# Patient Record
Sex: Female | Born: 1935 | Race: White | Hispanic: No | Smoking: Never smoker
Health system: Southern US, Community
[De-identification: ages and names within clinical notes are randomized; demographics above are authoritative.]

## PROBLEM LIST (undated history)

## (undated) DIAGNOSIS — F39 Unspecified mood [affective] disorder: Secondary | ICD-10-CM

## (undated) DIAGNOSIS — E039 Hypothyroidism, unspecified: Secondary | ICD-10-CM

## (undated) DIAGNOSIS — I10 Essential (primary) hypertension: Secondary | ICD-10-CM

## (undated) DIAGNOSIS — F0280 Dementia in other diseases classified elsewhere without behavioral disturbance: Secondary | ICD-10-CM

## (undated) HISTORY — PX: KNEE SURGERY: SHX244

## (undated) HISTORY — DX: Hypothyroidism, unspecified: E03.9

## (undated) HISTORY — PX: ABDOMINAL HYSTERECTOMY: SHX81

## (undated) HISTORY — PX: APPENDECTOMY: SHX54

## (undated) HISTORY — DX: Unspecified mood (affective) disorder: F39

## (undated) HISTORY — PX: BACK SURGERY: SHX140

---

## 2010-03-21 ENCOUNTER — Encounter: Admission: RE | Admit: 2010-03-21 | Discharge: 2010-03-21 | Payer: Self-pay | Admitting: Geriatric Medicine

## 2011-09-02 ENCOUNTER — Emergency Department: Payer: Self-pay | Admitting: *Deleted

## 2014-02-23 ENCOUNTER — Encounter (HOSPITAL_COMMUNITY): Payer: Self-pay | Admitting: Emergency Medicine

## 2014-02-23 ENCOUNTER — Emergency Department (HOSPITAL_COMMUNITY): Payer: Medicare Other

## 2014-02-23 ENCOUNTER — Emergency Department (HOSPITAL_COMMUNITY)
Admission: EM | Admit: 2014-02-23 | Discharge: 2014-02-23 | Disposition: A | Discharge status: Home / Self Care | Payer: Medicare Other | Attending: Emergency Medicine | Admitting: Emergency Medicine

## 2014-02-23 DIAGNOSIS — I10 Essential (primary) hypertension: Secondary | ICD-10-CM | POA: Diagnosis not present

## 2014-02-23 DIAGNOSIS — Z880 Allergy status to penicillin: Secondary | ICD-10-CM | POA: Diagnosis not present

## 2014-02-23 DIAGNOSIS — Z79899 Other long term (current) drug therapy: Secondary | ICD-10-CM | POA: Insufficient documentation

## 2014-02-23 DIAGNOSIS — R1011 Right upper quadrant pain: Secondary | ICD-10-CM | POA: Insufficient documentation

## 2014-02-23 DIAGNOSIS — Z7982 Long term (current) use of aspirin: Secondary | ICD-10-CM | POA: Insufficient documentation

## 2014-02-23 DIAGNOSIS — M545 Low back pain, unspecified: Secondary | ICD-10-CM | POA: Diagnosis not present

## 2014-02-23 DIAGNOSIS — R1013 Epigastric pain: Secondary | ICD-10-CM | POA: Insufficient documentation

## 2014-02-23 HISTORY — DX: Essential (primary) hypertension: I10

## 2014-02-23 LAB — CBC WITH DIFFERENTIAL/PLATELET
BASOS ABS: 0 10*3/uL (ref 0.0–0.1)
Basophils Relative: 1 % (ref 0–1)
EOS ABS: 0.2 10*3/uL (ref 0.0–0.7)
EOS PCT: 2 % (ref 0–5)
HCT: 40.1 % (ref 36.0–46.0)
Hemoglobin: 13.7 g/dL (ref 12.0–15.0)
Lymphocytes Relative: 24 % (ref 12–46)
Lymphs Abs: 1.6 10*3/uL (ref 0.7–4.0)
MCH: 31.5 pg (ref 26.0–34.0)
MCHC: 34.2 g/dL (ref 30.0–36.0)
MCV: 92.2 fL (ref 78.0–100.0)
MONO ABS: 0.8 10*3/uL (ref 0.1–1.0)
Monocytes Relative: 11 % (ref 3–12)
Neutro Abs: 4.2 10*3/uL (ref 1.7–7.7)
Neutrophils Relative %: 62 % (ref 43–77)
PLATELETS: 276 10*3/uL (ref 150–400)
RBC: 4.35 MIL/uL (ref 3.87–5.11)
RDW: 13.2 % (ref 11.5–15.5)
WBC: 6.8 10*3/uL (ref 4.0–10.5)

## 2014-02-23 LAB — COMPREHENSIVE METABOLIC PANEL
ALT: 24 U/L (ref 0–35)
AST: 23 U/L (ref 0–37)
Albumin: 3.9 g/dL (ref 3.5–5.2)
Alkaline Phosphatase: 94 U/L (ref 39–117)
Anion gap: 16 — ABNORMAL HIGH (ref 5–15)
BUN: 16 mg/dL (ref 6–23)
CALCIUM: 9.7 mg/dL (ref 8.4–10.5)
CO2: 23 mEq/L (ref 19–32)
CREATININE: 0.83 mg/dL (ref 0.50–1.10)
Chloride: 97 mEq/L (ref 96–112)
GFR calc non Af Amer: 61 mL/min — ABNORMAL LOW (ref >90)
GFR, EST AFRICAN AMERICAN: 70 mL/min — AB (ref >90)
Glucose, Bld: 104 mg/dL — ABNORMAL HIGH (ref 70–99)
Potassium: 3.9 mEq/L (ref 3.7–5.3)
Sodium: 136 mEq/L — ABNORMAL LOW (ref 137–147)
TOTAL PROTEIN: 7.1 g/dL (ref 6.0–8.3)
Total Bilirubin: 0.6 mg/dL (ref 0.3–1.2)

## 2014-02-23 LAB — URINE MICROSCOPIC-ADD ON

## 2014-02-23 LAB — URINALYSIS, ROUTINE W REFLEX MICROSCOPIC
Bilirubin Urine: NEGATIVE
GLUCOSE, UA: NEGATIVE mg/dL
Hgb urine dipstick: NEGATIVE
KETONES UR: NEGATIVE mg/dL
NITRITE: NEGATIVE
Protein, ur: NEGATIVE mg/dL
Specific Gravity, Urine: 1.007 (ref 1.005–1.030)
Urobilinogen, UA: 1 mg/dL (ref 0.0–1.0)
pH: 7 (ref 5.0–8.0)

## 2014-02-23 LAB — LIPASE, BLOOD: LIPASE: 27 U/L (ref 11–59)

## 2014-02-23 MED ORDER — HYDROCODONE-ACETAMINOPHEN 5-325 MG PO TABS: 1.0000 | ORAL_TABLET | ORAL | Status: DC | PRN

## 2014-02-23 MED ORDER — HYDROCODONE-ACETAMINOPHEN 5-325 MG PO TABS
2.0000 | ORAL_TABLET | Freq: Once | ORAL | Status: AC
  Administered 2014-02-23: 2 via ORAL
  Filled 2014-02-23: qty 2

## 2014-02-23 NOTE — ED Notes (Addendum)
Pt gone to CT at this time.

## 2014-02-23 NOTE — ED Notes (Signed)
Upon walking into pt room, family member found to be trying to log into [employer] to see if "her labs were done yet."

## 2014-02-23 NOTE — Progress Notes (Signed)
  CARE MANAGEMENT ED NOTE 02/23/2014  Patient:  Nichole Travis,Nichole Travis   Account Number:  [id number]  Date Initiated:  02/23/2014  Documentation initiated by:  Livia Snellen  Subjective/Objective Assessment:   Patient presents to Ed with back pain.     Subjective/Objective Assessment Detail:   hx of HTN, back surg x2     Action/Plan:   Action/Plan Detail:   Anticipated DC Date:       Status Recommendation to Physician:   Result of Recommendation:    Other ED [place]  Other  PCP issues    Choice offered to / List presented to:            Status of service:  Completed, signed off  ED Comments:   ED Comments Detail:  EDCM spoke to patient at bedisde.  Patient confirms her pcp is Dr. Felipa Eth.  System updated

## 2014-02-23 NOTE — ED Notes (Signed)
Pt states that he back started hurting 8/26 when she traveled. Denies falling or any strenuous activity on Wednesday. Pain has increased since then and has gotten to the point it hurts to sit down. Pain in located in lower back and moves to front of the abdomen. Pain moves down her right leg. Past history of back and knee surgery. Denies any n/v.  Patient denies taking pain medication this morning, only her daily medications.

## 2014-02-23 NOTE — Discharge Instructions (Signed)
Take 1 Vicodin every 4 hours as needed for pain Followup with your primary care for further evaluation and management of your back pain Return to ED for further evaluation if you experience fevers, abdominal pain, numbness or weakness, loss of bowel or bladder function, any new rashes.

## 2014-02-23 NOTE — ED Notes (Signed)
Pt presents to ED with complaints of lower back pain that radiates to the front of the abdomen and radiates down the right leg. Denies any strenuous activity or injuries. Denies n/v and diarrhea. Denies chills and fever.

## 2014-02-23 NOTE — ED Provider Notes (Signed)
CSN: [id number]     Arrival date & time 02/23/14  1235 History   First MD Initiated Contact with Patient 02/23/14 1625     Chief Complaint  Patient presents with  . Back Pain     (Consider location/radiation/quality/duration/timing/severity/associated sxs/prior Treatment) HPI Dayne Passarella is a 80 y.o. female with hx of HTN, back surg x2, who comes in for evaluation of R lower back pain. She reports the pain started 3 days ago when she was getting into a friends car. She denies any injury or trauma at the time. She says the pain has gotten progressively worse over the last three days, is intermittent and radiates to her RUQ. She reports the pain is a 10 at its worst and is aggravated with movement and position.There is no association with eating pattern. She has been able to walk independently with the assistance of her cane. She denies any numbness or tingling. No loss of bowel or bladder function. Her son is with her and reports she has had 2 falls recently, the most recent one a month ago and she tripped while stepping over a threshold of a door. Son reports at that time she landed on her L side. She denies fevers, SOB, cp, N/V, D/C, weakness or dizziness. No underlying cardiovascular or pulmonary disease. Non smoker.  Past Medical History  Diagnosis Date  . Hypertension    Past Surgical History  Procedure Laterality Date  . Back surgery    . Abdominal hysterectomy    . Knee surgery Bilateral    History reviewed. No pertinent family history. History  Substance Use Topics  . Smoking status: Never Smoker   . Smokeless tobacco: Not on file  . Alcohol Use: 1.2 oz/week    2 Glasses of wine per week   OB History   Grav Para Term Preterm Abortions TAB SAB Ect Mult Living                 Review of Systems  Constitutional: Negative for fever.  HENT: Negative for sore throat.   Eyes: Negative for visual disturbance.  Respiratory: Negative for shortness of breath.   Cardiovascular:  Negative for chest pain.  Gastrointestinal: Positive for abdominal pain.  Endocrine: Negative for polyuria.  Genitourinary: Negative for dysuria.  Musculoskeletal: Positive for back pain.  Skin: Negative for rash.  Neurological: Negative for headaches.  All other systems reviewed and are negative.     Allergies  Penicillins  Home Medications   Prior to Admission medications   Medication Sig Start Date End Date Taking? Authorizing Provider  acetaminophen (TYLENOL) 500 MG tablet Take 1,000 mg by mouth every 6 (six) hours as needed for moderate pain.   Yes Historical Provider, MD  amLODipine (NORVASC) 10 MG tablet Take 10 mg by mouth daily.   Yes Historical Provider, MD  aspirin 325 MG EC tablet Take 325 mg by mouth daily.   Yes Historical Provider, MD  citalopram (CELEXA) 40 MG tablet Take 40 mg by mouth daily.   Yes Historical Provider, MD  Glucosamine-Chondroitin (OSTEO BI-FLEX REGULAR STRENGTH) 250-200 MG TABS Take 2 tablets by mouth See admin instructions. Daily after meals   Yes Historical Provider, MD  hydrOXYzine (ATARAX/VISTARIL) 10 MG tablet Take 10 mg by mouth 3 times/day as needed-between meals & bedtime for itching.   Yes Historical Provider, MD  levothyroxine (SYNTHROID, LEVOTHROID) 50 MCG tablet Take 50 mcg by mouth daily before breakfast.   Yes Historical Provider, MD  losartan (COZAAR) 25 MG tablet Take  25 mg by mouth daily.   Yes Historical Provider, MD  metoprolol succinate (TOPROL-XL) 50 MG 24 hr tablet Take 50 mg by mouth daily. Take with or immediately following a meal.   Yes Historical Provider, MD  Multiple Vitamin (MULTIVITAMIN WITH MINERALS) TABS tablet Take 1 tablet by mouth daily.   Yes Historical Provider, MD  Multiple Vitamins-Minerals (ONE-A-DAY 50 PLUS PO) Take 1 tablet by mouth daily.   Yes Historical Provider, MD  HYDROcodone-acetaminophen (NORCO/VICODIN) 5-325 MG per tablet Take 1 tablet by mouth every 4 (four) hours as needed for moderate pain or severe  pain. 02/23/14   Viona Gilmore Gavinn Collard, PA-C   BP 148/61  Pulse 58  Temp(Src) 99 F (37.2 C) (Oral)  Resp 18  Ht 5\' 7"  (1.702 m)  Wt 210 lb (95.255 kg)  BMI 32.88 kg/m2  SpO2 94% Physical Exam  Nursing note and vitals reviewed. Constitutional: She is oriented to person, place, and time.  Awake, alert, nontoxic appearance with baseline speech.  HENT:  Head: Atraumatic.  Eyes: Pupils are equal, round, and reactive to light. Right eye exhibits no discharge. Left eye exhibits no discharge.  Neck: Neck supple.  Cardiovascular: Normal rate and regular rhythm.   No murmur heard. Pulmonary/Chest: Effort normal and breath sounds normal. No respiratory distress. She has no wheezes. She has no rales. She exhibits no tenderness.  Abdominal: Soft. Bowel sounds are normal. She exhibits no mass. There is no rebound.  Mild TTP over RUQ and diffuse tenderness in mid epigastrium. No pulsatile masses appreciated  Musculoskeletal:  Tenderness located over R lumbar paravertebral muscles. No bony tenderness. Bilateral lower extremities non tender without new rashes or color change, baseline ROM with intact DP / PT pulses, CR<2 secs all digits bilaterally, sensation baseline light touch bilaterally for pt, motor symmetric bilateral 5 / 5 hip flexion, quadriceps, hamstrings, EHL, foot dorsiflexion, foot plantarflexion, gait somewhat antalgic but without apparent new ataxia.  Neurological: She is alert and oriented to person, place, and time.  Mental status baseline for patient.  Upper and lower extremity motor strength and sensation intact and symmetric bilaterally.  Skin: Skin is warm and dry. No rash noted.  Psychiatric: She has a normal mood and affect.    ED Course  Procedures (including critical care time) Labs Review Labs Reviewed  COMPREHENSIVE METABOLIC PANEL - Abnormal; Notable for the following:    Sodium 136 (*)    Glucose, Bld 104 (*)    GFR calc non Af Amer 61 (*)    GFR calc Af Amer 70  (*)    Anion gap 16 (*)    All other components within normal limits  URINALYSIS, ROUTINE W REFLEX MICROSCOPIC - Abnormal; Notable for the following:    Leukocytes, UA SMALL (*)    All other components within normal limits  CBC WITH DIFFERENTIAL  LIPASE, BLOOD  URINE MICROSCOPIC-ADD ON    Imaging Review Ct Abdomen Pelvis Wo Contrast  02/23/2014   CLINICAL DATA:  Back pain, lower abdominal pain, right leg pain  EXAM: CT ABDOMEN AND PELVIS WITHOUT CONTRAST  TECHNIQUE: Multidetector CT imaging of the abdomen and pelvis was performed following the standard protocol without IV contrast.  COMPARISON:  None.  FINDINGS: Sagittal images of the spine shows extensive degenerative changes lumbar spine multilevel.  Degenerative changes bilateral SI joints.  Small hiatal hernia. The lung bases are unremarkable. Unenhanced liver shows no biliary ductal dilatation. The gallbladder is not identified. Unenhanced pancreas is atrophic. The spleen and adrenal glands  are unremarkable. Kidneys are symmetrical in size. No nephrolithiasis. No hydronephrosis or hydroureter. Mild atherosclerotic calcifications of abdominal aorta and iliac arteries.  No small bowel obstruction. No ascites or free air. No adenopathy. Left colon diverticula are noted. Multiple sigmoid colon diverticula. No evidence of acute diverticulitis. There is significant distended urinary bladder. The patient is status post hysterectomy.  There is no pericecal inflammation. The terminal ileum is unremarkable.  IMPRESSION: 1. No nephrolithiasis.  No hydronephrosis. or hydroureter. 2. Significant distended urinary bladder without evidence of calcified calculi. 3. Status post hysterectomy. 4. No calcified ureteral calculi. 5. Left colon and sigmoid colon diverticula are noted. No evidence of acute diverticulitis. 6. Degenerative changes lumbar spine and bilateral SI joints.   Electronically Signed   By: Lahoma Crocker M.D.   On: 02/23/2014 17:25     EKG  Interpretation None     Meds given in ED:  Medications  HYDROcodone-acetaminophen (NORCO/VICODIN) 5-325 MG per tablet 2 tablet (2 tablets Oral Given 02/23/14 1704)    New Prescriptions   HYDROCODONE-ACETAMINOPHEN (NORCO/VICODIN) 5-325 MG PER TABLET    Take 1 tablet by mouth every 4 (four) hours as needed for moderate pain or severe pain.   Filed Vitals:   02/23/14 1325 02/23/14 1609 02/23/14 1829  BP: 149/72 174/65 148/61  Pulse: 63 63 58  Temp: 99 F (37.2 C)    TempSrc: Oral    Resp: 24 18 18   Height: 5\' 7"  (1.702 m)    Weight: 210 lb (95.255 kg)    SpO2: 100% 100% 94%    MDM  Vitals stable - WNL -afebrile Pt resting comfortably in ED. PE consistent with MSK etiology- pain reproducible Labwork not concerning for infection  Imaging shows no emergent bony pathology, no AAA or kidney pathology, Clinical picture not consistent with cauda equina. DDX- will discuss potential for Zoster and f/u precautions Will DC with pain management and instructions for f/u. Discussed f/u with PCP and return precautions, pt very amenable to plan.   Final diagnoses:  Right-sided low back pain without sciatica  Prior to patient discharge, I discussed and reviewed this case with Dr.Steinl         Verl Dicker, PA-C 02/23/14 1835

## 2014-02-24 NOTE — ED Provider Notes (Signed)
Medical screening examination/treatment/procedure(s) were conducted as a shared visit with non-physician practitioner(s) and myself.  I personally evaluated the patient during the encounter.  Pt c/o low back pain. Dull. No hx chronic back pain. No hx aaa. Mainly right sided. No radicular pain. No perineal or leg numbness. No weakness. No fever or chills. No rash/shingles to area of pain. Spine without focal bony tenderness.    Mirna Mires, MD 02/24/14 1535

## 2016-05-19 ENCOUNTER — Ambulatory Visit (INDEPENDENT_AMBULATORY_CARE_PROVIDER_SITE_OTHER): Payer: Medicare Other | Admitting: Interventional Cardiology

## 2016-05-19 VITALS — BP 118/74 | HR 64 | Ht 66.5 in | Wt 202.0 lb

## 2016-05-19 DIAGNOSIS — I10 Essential (primary) hypertension: Secondary | ICD-10-CM

## 2016-05-19 DIAGNOSIS — R55 Syncope and collapse: Secondary | ICD-10-CM | POA: Diagnosis not present

## 2016-05-19 DIAGNOSIS — Z136 Encounter for screening for cardiovascular disorders: Secondary | ICD-10-CM | POA: Diagnosis not present

## 2016-05-19 NOTE — Progress Notes (Signed)
Cardiology Office Note   Date:  05/19/2016   ID:  Nichole Travis, DOB [birth year], MRN [id number]  PCP:  Mathews Argyle, MD    No chief complaint on file. syncope   Wt Readings from Last 3 Encounters:  05/19/16 91.6 kg (202 lb)  02/23/14 95.3 kg (210 lb)       History of Present Illness: Nichole Travis is a 82 y.o. female  Who has had HTN.  She lives independently in a retirement community.  She drives and cooks her own meals. She does not do any regular walking for exercise but she can walk if she needs to complete a task.  A few days ago, she was sitting in a chair in her apartment and felt like she passed out. The last time she remembers was 6:15 PM. When she woke up, there was some indication that it was 8 PM but she does not recall exactly what that indication was. She got up, walked to the bedroom and slept the rest of the night. She woke up in the morning and felt fine. She has not had any lightheadedness or syncope since that time. She has had a normal appetite. She has been completing her tasks like she normally does.  She is adamant that she did not fall asleep. This was something different.    Past Medical History:  Diagnosis Date  . Hypertension     Past Surgical History:  Procedure Laterality Date  . ABDOMINAL HYSTERECTOMY    . BACK SURGERY    . KNEE SURGERY Bilateral      Current Outpatient Prescriptions  Medication Sig Dispense Refill  . acetaminophen (TYLENOL) 500 MG tablet Take 1,000 mg by mouth every 6 (six) hours as needed for moderate pain.    Marland Kitchen amLODipine (NORVASC) 10 MG tablet Take 10 mg by mouth daily.    Marland Kitchen aspirin 325 MG EC tablet Take 325 mg by mouth daily.    . citalopram (CELEXA) 40 MG tablet Take 40 mg by mouth daily.    . Glucosamine-Chondroitin (OSTEO BI-FLEX REGULAR STRENGTH) 250-200 MG TABS Take 2 tablets by mouth See admin instructions. Daily after meals    . hydrOXYzine (ATARAX/VISTARIL) 10 MG tablet Take 10 mg by mouth 3  times/day as needed-between meals & bedtime for itching.    . levothyroxine (SYNTHROID, LEVOTHROID) 50 MCG tablet Take 50 mcg by mouth daily before breakfast.    . losartan (COZAAR) 25 MG tablet Take 25 mg by mouth daily.    . metoprolol succinate (TOPROL-XL) 50 MG 24 hr tablet Take 50 mg by mouth daily. Take with or immediately following a meal.    . Multiple Vitamin (MULTIVITAMIN WITH MINERALS) TABS tablet Take 1 tablet by mouth daily.    . Multiple Vitamins-Minerals (ONE-A-DAY 50 PLUS PO) Take 1 tablet by mouth daily.     No current facility-administered medications for this visit.     Allergies:   Penicillins    Social History:  The patient  reports that she has never smoked. She does not have any smokeless tobacco history on file. She reports that she drinks about 1.2 oz of alcohol per week . She reports that she does not use drugs.   Family History:  The patient's family history is not on file.    ROS:  Please see the history of present illness.   Otherwise, review of systems are positive for episode above of possible syncope, occasional joint pains.   All other systems are  reviewed and negative.    PHYSICAL EXAM: VS:  BP 118/74 (BP Location: Left Arm)   Pulse 64   Ht 5' 6.5" (1.689 m)   Wt 91.6 kg (202 lb)   BMI 32.12 kg/m  , BMI Body mass index is 32.12 kg/m. GEN: Well nourished, well developed, in no acute distress  HEENT: normal  Neck: no JVD, carotid bruits, or masses Cardiac: RRR; no murmurs, rubs, or gallops,no edema  Respiratory:  clear to auscultation bilaterally, normal work of breathing GI: soft, nontender, nondistended, + BS MS: no deformity or atrophy  Skin: warm and dry, no rash Neuro:  Strength and sensation are intact Psych: euthymic mood, full affect   EKG:   The ekg ordered today demonstrates normal sinus rhythm, no ST segment changes, no conduction abnormalities   Recent Labs: No results found for requested labs within last 8760 hours.   Lipid  Panel No results found for: CHOL, TRIG, HDL, CHOLHDL, VLDL, LDLCALC, LDLDIRECT   Other studies Reviewed: Additional studies/ records that were reviewed today with results demonstrating: Lab work was checked by primary care doctor yesterday but results are not available.   ASSESSMENT AND PLAN:  1. ? Syncope: Unclear etiology of this event. She is adamant that she did not fall asleep. I did explain to her that if the event truly was an hour and 45 minutes, it was unlikely to be some heart rhythm disturbance lasting for that long period of time. She has not had any orthostatic symptoms and regardless, the episode happened while she was just sitting in a chair. She has no signs or symptoms of heart failure. Her cardiac exam and ECG are unremarkable. She has no anginal symptoms. I don't think we need to pursue any further cardiac testing. I don't think the episode that she had yesterday came from a cardiac source. I encouraged her to stay well hydrated and eat regularly.  2. Hypertension: Continue current medications. Blood pressure well controlled.   Current medicines are reviewed at length with the patient today.  The patient concerns regarding her medicines were addressed.  The following changes have been made:  No change  Labs/ tests ordered today include:  No orders of the defined types were placed in this encounter.   Recommend 150 minutes/week of aerobic exercise Low fat, low carb, high fiber diet recommended  Disposition:   FU prn   Signed, Larae Grooms, MD  05/19/2016 3:38 PM    [facility] [address], [place], [place]  [address] Phone: [phone]; Fax: [phone]

## 2016-05-19 NOTE — Patient Instructions (Signed)
Medication Instructions:  Same-no change  Labwork: None  Testing/Procedures: None  Follow-Up: As needed     If you need a refill on your cardiac medications before your next appointment, please call your pharmacy.

## 2016-05-20 ENCOUNTER — Encounter: Payer: Self-pay | Admitting: Interventional Cardiology

## 2016-05-20 DIAGNOSIS — I10 Essential (primary) hypertension: Secondary | ICD-10-CM | POA: Insufficient documentation

## 2017-08-13 ENCOUNTER — Other Ambulatory Visit: Payer: Self-pay | Admitting: Nurse Practitioner

## 2017-08-13 ENCOUNTER — Ambulatory Visit
Admission: RE | Admit: 2017-08-13 | Discharge: 2017-08-13 | Disposition: A | Discharge status: Home / Self Care | Payer: Medicare Other | Source: Ambulatory Visit | Attending: Nurse Practitioner | Admitting: Nurse Practitioner

## 2017-08-13 DIAGNOSIS — M25551 Pain in right hip: Secondary | ICD-10-CM

## 2017-08-27 ENCOUNTER — Other Ambulatory Visit: Payer: Self-pay | Admitting: Orthopedic Surgery

## 2017-08-27 DIAGNOSIS — M545 Low back pain: Secondary | ICD-10-CM

## 2017-08-27 DIAGNOSIS — M5410 Radiculopathy, site unspecified: Secondary | ICD-10-CM

## 2017-09-04 ENCOUNTER — Other Ambulatory Visit: Payer: Self-pay

## 2019-08-23 DIAGNOSIS — L120 Bullous pemphigoid: Secondary | ICD-10-CM

## 2019-08-23 HISTORY — DX: Bullous pemphigoid: L12.0

## 2019-08-27 ENCOUNTER — Encounter: Payer: Self-pay | Admitting: Emergency Medicine

## 2019-08-27 ENCOUNTER — Other Ambulatory Visit: Payer: Self-pay

## 2019-08-27 ENCOUNTER — Emergency Department: Payer: Medicare Other

## 2019-08-27 ENCOUNTER — Emergency Department
Admission: EM | Admit: 2019-08-27 | Discharge: 2019-08-27 | Disposition: A | Discharge status: Home / Self Care | Payer: Medicare Other | Attending: Emergency Medicine | Admitting: Emergency Medicine

## 2019-08-27 DIAGNOSIS — W19XXXA Unspecified fall, initial encounter: Secondary | ICD-10-CM

## 2019-08-27 DIAGNOSIS — W010XXA Fall on same level from slipping, tripping and stumbling without subsequent striking against object, initial encounter: Secondary | ICD-10-CM | POA: Diagnosis not present

## 2019-08-27 DIAGNOSIS — Z79899 Other long term (current) drug therapy: Secondary | ICD-10-CM | POA: Insufficient documentation

## 2019-08-27 DIAGNOSIS — F0280 Dementia in other diseases classified elsewhere without behavioral disturbance: Secondary | ICD-10-CM | POA: Insufficient documentation

## 2019-08-27 DIAGNOSIS — G309 Alzheimer's disease, unspecified: Secondary | ICD-10-CM | POA: Insufficient documentation

## 2019-08-27 DIAGNOSIS — Z7982 Long term (current) use of aspirin: Secondary | ICD-10-CM | POA: Diagnosis not present

## 2019-08-27 DIAGNOSIS — Z043 Encounter for examination and observation following other accident: Secondary | ICD-10-CM | POA: Insufficient documentation

## 2019-08-27 DIAGNOSIS — I10 Essential (primary) hypertension: Secondary | ICD-10-CM | POA: Diagnosis not present

## 2019-08-27 HISTORY — DX: Dementia in other diseases classified elsewhere, unspecified severity, without behavioral disturbance, psychotic disturbance, mood disturbance, and anxiety: F02.80

## 2019-08-27 NOTE — Discharge Instructions (Addendum)
Nichole Travis was evaluated in the emergency department.  She had a CT scan of her head which showed no acute findings.  She does not demonstrate signs of other injuries.  She should return to the emergency department for any new or worsening pain, confusion or change in mental status, or any other new or worsening symptoms that are concerning.

## 2019-08-27 NOTE — ED Triage Notes (Signed)
Pt arrives via [facility] for an unwitnessed fall. Pt states that her feet hurt and that made her fall. Pt also stating that right shoulder hurts. Pt has hx of Altzheimer's and HTN.

## 2019-08-27 NOTE — ED Provider Notes (Signed)
[name] Emergency Department Provider Note ____________________________________________   First MD Initiated Contact with Patient 08/27/19 [phone]     (approximate)  I have reviewed the triage vital signs and the nursing notes.   HISTORY  Chief Complaint Fall  Level 5 caveat: History of present illness limited due to dementia  HPI Nichole Travis is a 86 y.o. female with PMH as noted below who presents from her facility after an unwitnessed fall.  The patient knows that she fell, but does not remember what happened.  She states that she thinks she fell because her feet hurt.  She denies any acute pain at this time.  She reported some right shoulder pain at triage, but now states "it does'nt even hurt."  She has no other acute complaints.  Past Medical History:  Diagnosis Date  . Alzheimer disease ([place])   . Hypertension     Patient Active Problem List   Diagnosis Date Noted  . Essential hypertension 05/20/2016    Past Surgical History:  Procedure Laterality Date  . ABDOMINAL HYSTERECTOMY    . BACK SURGERY    . KNEE SURGERY Bilateral     Prior to Admission medications   Medication Sig Start Date End Date Taking? Authorizing Provider  acetaminophen (TYLENOL) 500 MG tablet Take 1,000 mg by mouth every 6 (six) hours as needed for moderate pain.    [provider]  amLODipine (NORVASC) 10 MG tablet Take 10 mg by mouth daily.    [provider]  aspirin 325 MG EC tablet Take 325 mg by mouth daily.    [provider]  citalopram (CELEXA) 40 MG tablet Take 40 mg by mouth daily.    [provider]  Glucosamine-Chondroitin (OSTEO BI-FLEX REGULAR STRENGTH) 250-200 MG TABS Take 2 tablets by mouth See admin instructions. Daily after meals    [provider]  hydrOXYzine (ATARAX/VISTARIL) 10 MG tablet Take 10 mg by mouth 3 times/day as needed-between meals & bedtime for itching.    [provider]    levothyroxine (SYNTHROID, LEVOTHROID) 50 MCG tablet Take 50 mcg by mouth daily before breakfast.    [provider]  losartan (COZAAR) 25 MG tablet Take 25 mg by mouth daily.    [provider]  metoprolol succinate (TOPROL-XL) 50 MG 24 hr tablet Take 50 mg by mouth daily. Take with or immediately following a meal.    [provider]  Multiple Vitamin (MULTIVITAMIN WITH MINERALS) TABS tablet Take 1 tablet by mouth daily.    [provider]  Multiple Vitamins-Minerals (ONE-A-DAY 50 PLUS PO) Take 1 tablet by mouth daily.    [provider]    Allergies Penicillins  No family history on file.  Social History Social History   Tobacco Use  . Smoking status: Never Smoker  . Smokeless tobacco: Never Used  Substance Use Topics  . Alcohol use: Yes    Alcohol/week: 2.0 standard drinks    Types: 2 Glasses of wine per week  . Drug use: No    Review of Systems Level 5 caveat: Unable to obtain review of systems due to dementia    ____________________________________________   PHYSICAL EXAM:  VITAL SIGNS: ED Triage Vitals  Enc Vitals Group     BP 08/27/19 0659 (!) 194/79     Pulse Rate 08/27/19 0659 (!) 58     Resp 08/27/19 0659 (!) 25     Temp 08/27/19 0659 97.7 F (36.5 C)     Temp  Source 08/27/19 0659 Oral     SpO2 08/27/19 0659 94 %     Weight 08/27/19 0655 200 lb (90.7 kg)     Height 08/27/19 0655 5\' 6"  (1.676 m)     Head Circumference --      Peak Flow --      Pain Score --      Pain Loc --      Pain Edu? --      Excl. in [place]? --     Constitutional: Alert, confused.  Very well appearing and in no acute distress. Eyes: Conjunctivae are normal.  EOMI.  PERRLA. Head: Atraumatic. Nose: No congestion/rhinnorhea. Mouth/Throat: Mucous membranes are moist.   Neck: Normal range of motion.  No midline cervical spinal tenderness. Cardiovascular: Normal rate, regular rhythm. Good peripheral circulation. Respiratory: Normal  respiratory effort.  No retractions.  Gastrointestinal: Soft and nontender. No distention.  Genitourinary: No flank tenderness. Musculoskeletal: Extremities warm and well perfused.  Full range of motion to bilateral upper and lower shoulders, elbows, wrists, hips, knees, ankles.  2+ distal pulses in all extremities. Neurologic:  Normal speech and language.  Motor and sensory intact in all extremities.  Normal coordination.   Skin:  Skin is warm and dry. No rash noted. Psychiatric: Calm and cooperative.  ____________________________________________   LABS (all labs ordered are listed, but only abnormal results are displayed)  Labs Reviewed - No data to display ____________________________________________  EKG  ED ECG REPORT I, Arta Silence, the attending physician, personally viewed and interpreted this ECG.  Date: 08/27/2019 EKG Time: 0657 Rate: 61 Rhythm: normal sinus rhythm QRS Axis: normal Intervals: normal ST/T Wave abnormalities: normal Narrative Interpretation: no evidence of acute ischemia  ____________________________________________  RADIOLOGY  CT head: No ICH or other acute abnormalities  ____________________________________________   PROCEDURES  Procedure(s) performed: No  Procedures  Critical Care performed: No ____________________________________________   INITIAL IMPRESSION / ASSESSMENT AND PLAN / ED COURSE  Pertinent labs & imaging results that were available during my care of the patient were reviewed by me and considered in my medical decision making (see chart for details).  86 year old female with PMH as noted above presents with an unwitnessed fall.  She apparently initially reported some right shoulder pain but denies it currently.  She has no other complaints.  On exam, she is very well-appearing.  She is alert and confused, but very responsive and talkative.  Her vital signs are normal except for hypertension.  Neurologic exam is  nonfocal.  There is no visible trauma.  She has normal range of motion to the joints of bilateral upper and lower extremities, including the right shoulder.  There is no tenderness to the shoulder.  At this time, there is no evidence of acute injury.  Because of the patient's age, dementia, inability to remember the event, and the possibility of occult head injury I will obtain a CT head.  If this is negative, the patient will be appropriate for discharge back to her facility.  There is no evidence of acute medical issue or indication for lab work-up.  ----------------------------------------- 8:25 AM on 08/27/2019 -----------------------------------------  CT head is negative.  The patient is stable for discharge to her facility.  Return precautions have been provided.  ____________________________________________   FINAL CLINICAL IMPRESSION(S) / ED DIAGNOSES  Final diagnoses:  Fall, initial encounter      NEW MEDICATIONS STARTED DURING THIS VISIT:  New Prescriptions   No medications on file     Note:  This document  was prepared using Systems analyst and may include unintentional dictation errors.    Arta Silence, MD 08/27/19 [phone]

## 2019-08-27 NOTE — ED Notes (Signed)
Pt placed on bedpan to urinate and have brown BM. Cleaned with wipes and placed brief on pt.

## 2019-08-27 NOTE — ED Notes (Signed)
Attempted to call [facility], was unable to get through.

## 2019-08-27 NOTE — ED Notes (Signed)
Heard pt yelling in room. Pt stated she needed to urinate again. Placed on bedpan. Cleaned with wipes. Dry brief placed on pt.

## 2019-09-15 ENCOUNTER — Other Ambulatory Visit: Payer: Self-pay

## 2019-09-15 ENCOUNTER — Ambulatory Visit (INDEPENDENT_AMBULATORY_CARE_PROVIDER_SITE_OTHER): Payer: Medicare Other | Admitting: Dermatology

## 2019-09-15 DIAGNOSIS — L120 Bullous pemphigoid: Secondary | ICD-10-CM

## 2019-09-15 DIAGNOSIS — L089 Local infection of the skin and subcutaneous tissue, unspecified: Secondary | ICD-10-CM | POA: Diagnosis not present

## 2019-09-15 NOTE — Progress Notes (Signed)
   Follow-Up Visit   Subjective  Nichole Travis is a 86 y.o. female who presents for the following: Pain in feet (B/L foot) and bullous pemphigoid.   She reports itch is better but her heels continue to hurt. She denies new blisters at this time. She is taking medications as instructed. They have had trouble with dressings not staying in place on her heels.  The following portions of the chart were reviewed this encounter and updated as appropriate:     Review of Systems: No other skin or systemic complaints.  Objective  Well appearing patient in no apparent distress; mood and affect are within normal limits.  A focused examination was performed including face, chest, arms, legs, feet. Relevant physical exam findings are noted in the Assessment and Plan.  Objective  trunk and extremities: Few residual erosions, no active bullae today  Assessment & Plan  Bullous pemphigoid trunk and extremities  Improving Continue doxycycline 100 mg bid Continue clobetasol 0.05% ointment bid prn to new areas of itch or blistering Patient just finishing prednisone taper, will monitor  For erosions, cleanse with puracyn and apply duoderm, leave in place for 2-3 days. Can use a wrap to help keep dressing in place over heels  Local infection of skin and subcutaneous tissue Mixed toe webbing B/L foot  Mixed toe web infection  Will continue Ciclopirox BID Will start Gentamicin cream BID  Return for As Scheduled.   Marene Lenz, CMA am acting as scribe for Forest Gleason, MD .  Documentation: I have reviewed the above documentation for accuracy and completeness, and I agree with the above.  Forest Gleason, MD

## 2019-09-19 ENCOUNTER — Telehealth: Payer: Self-pay

## 2019-09-19 MED ORDER — CLOBETASOL PROPIONATE 0.05 % EX OINT: 1.0000 "application " | TOPICAL_OINTMENT | Freq: Two times a day (BID) | CUTANEOUS | 0 refills | Status: DC

## 2019-09-19 NOTE — Telephone Encounter (Signed)
Care taker calling pt has  2 new blisters on her feet

## 2019-09-19 NOTE — Telephone Encounter (Signed)
Called pt care taker discussed starting Clobetasol ointment bid to blisters and if any additional blisters appear let us know.    ERX'd Clobetasol 0.05% ointment to [facility]

## 2019-09-19 NOTE — Telephone Encounter (Signed)
Please advise caretaker to apply clobetasol ointment twice daily to those blisters and surrounding areas. Please let us know if she develops more. Thank you!

## 2019-09-19 NOTE — Telephone Encounter (Signed)
Thank you :)

## 2019-09-20 ENCOUNTER — Encounter: Payer: Self-pay | Admitting: Dermatology

## 2019-09-20 MED ORDER — GENTAMICIN SULFATE 0.1 % EX CREA: 1.0000 "application " | TOPICAL_CREAM | Freq: Two times a day (BID) | CUTANEOUS | 1 refills | Status: AC

## 2019-09-25 DIAGNOSIS — G309 Alzheimer's disease, unspecified: Secondary | ICD-10-CM | POA: Diagnosis not present

## 2019-09-25 DIAGNOSIS — I10 Essential (primary) hypertension: Secondary | ICD-10-CM | POA: Diagnosis not present

## 2019-09-25 DIAGNOSIS — F39 Unspecified mood [affective] disorder: Secondary | ICD-10-CM

## 2019-09-25 DIAGNOSIS — L120 Bullous pemphigoid: Secondary | ICD-10-CM | POA: Diagnosis not present

## 2019-09-25 DIAGNOSIS — G301 Alzheimer's disease with late onset: Secondary | ICD-10-CM | POA: Diagnosis not present

## 2019-09-28 ENCOUNTER — Ambulatory Visit (INDEPENDENT_AMBULATORY_CARE_PROVIDER_SITE_OTHER): Payer: Medicare Other | Admitting: Dermatology

## 2019-09-28 ENCOUNTER — Other Ambulatory Visit: Payer: Self-pay

## 2019-09-28 DIAGNOSIS — S91302D Unspecified open wound, left foot, subsequent encounter: Secondary | ICD-10-CM

## 2019-09-28 DIAGNOSIS — S91301D Unspecified open wound, right foot, subsequent encounter: Secondary | ICD-10-CM

## 2019-09-28 DIAGNOSIS — T148XXA Other injury of unspecified body region, initial encounter: Secondary | ICD-10-CM

## 2019-09-28 DIAGNOSIS — L120 Bullous pemphigoid: Secondary | ICD-10-CM | POA: Diagnosis not present

## 2019-09-28 MED ORDER — PREDNISONE 10 MG PO TABS: 10.0000 mg | ORAL_TABLET | Freq: Every day | ORAL | 0 refills | Status: AC

## 2019-09-28 MED ORDER — PREDNISONE 5 MG PO TABS: 5.0000 mg | ORAL_TABLET | Freq: Every day | ORAL | 0 refills | Status: AC

## 2019-09-28 NOTE — Progress Notes (Signed)
   Follow-Up Visit   Subjective  Nichole Travis is a 86 y.o. female who presents for the following: ulcerations on heels (Follow up, getting daily dressing changes, was moved to skill nursing for next 2 weeks, seems to be getting a little better but still painful  at dressing changes) and Pruritis (Still itchy on arms and head and back, doing gentle skin care.).   The following portions of the chart were reviewed this encounter and updated as appropriate: Tobacco  Allergies  Meds  Problems  Med Hx  Surg Hx  Fam Hx      Review of Systems: No other skin or systemic complaints.  Objective  Well appearing patient in no apparent distress; mood and affect are within normal limits.  A focused examination was performed including extremities, including the arms, hands, fingers, and fingernails and the legs, feet, toes, and toenails. Relevant physical exam findings are noted in the Assessment and Plan.  Objective  Left Foot - Anterior, Left Forearm - Anterior, Left Lower Leg - Anterior, Right Foot - Anterior, Right Forearm - Anterior, Right Lower Leg - Anterior: Edematous red plaques on B/L arms and legs  Multiple draining bullae at feet, ulcerations at heel are improved  Objective  Left Foot - Anterior, Right Foot - Anterior: Multiple draining bullae at lateral and plantar feet, ulcerations at heels are improved  Assessment & Plan  Bullous pemphigoid (6) Left Foot - Anterior; Right Foot - Anterior; Left Forearm - Anterior; Right Forearm - Anterior; Left Lower Leg - Anterior; Right Lower Leg - Anterior  Chronic, currently flaring.  Will cont. Clobetasol twice daily to any areas that have rash or are itchy. Apply twice a day to any blisters that are not draining and surrounding any blisters that are not draining.  Continue Doxycycline 100 mg  BID  Start prednisone 10 mg daily x 14 days, then decrease to 5 mg daily for 14 days.    Risks of prednisone taper discussed including mood  irritability, insomnia, weight gain, stomach ulcers, increased risk of infection, increased blood sugar (diabetes), hypertension, osteoporosis with long-term or frequent use, and rare risk of avascular necrosis of the hip.    If Nichole Travis develops any new blisters, please call our office at [phone] option 4 to let us know as we may need to adjust her medication before the next visit   predniSONE (DELTASONE) 10 MG tablet - Left Foot - Anterior, Left Forearm - Anterior, Left Lower Leg - Anterior, Right Foot - Anterior, Right Forearm - Anterior, Right Lower Leg - Anterior  predniSONE (DELTASONE) 5 MG tablet - Left Foot - Anterior, Left Forearm - Anterior, Left Lower Leg - Anterior, Right Foot - Anterior, Right Forearm - Anterior, Right Lower Leg - Anterior  Open wound (2) Left Foot - Anterior; Right Foot - Anterior  To all open areas, apply mupirocin daily covered by non stick dressing (apply before applying any guaze so that the wound stays more moist with mupirocin instead of soaking it up). Then apply bandage or wrap to keep dressing in place. Continue Doxycycline 100 mg  BID  Return in about 3 weeks (around 10/19/2019) for Bullous Pemphigoid.   IDonzetta Travis, CMA, am acting as scribe for Forest Gleason, MD .  Documentation: I have reviewed the above documentation for accuracy and completeness, and I agree with the above.  Forest Gleason, MD

## 2019-10-10 ENCOUNTER — Encounter: Payer: Self-pay | Admitting: Dermatology

## 2019-10-19 ENCOUNTER — Ambulatory Visit (INDEPENDENT_AMBULATORY_CARE_PROVIDER_SITE_OTHER): Payer: Medicare Other | Admitting: Dermatology

## 2019-10-19 ENCOUNTER — Other Ambulatory Visit: Payer: Self-pay

## 2019-10-19 ENCOUNTER — Encounter: Payer: Self-pay | Admitting: Dermatology

## 2019-10-19 DIAGNOSIS — I10 Essential (primary) hypertension: Secondary | ICD-10-CM | POA: Diagnosis not present

## 2019-10-19 DIAGNOSIS — G301 Alzheimer's disease with late onset: Secondary | ICD-10-CM | POA: Diagnosis not present

## 2019-10-19 DIAGNOSIS — L821 Other seborrheic keratosis: Secondary | ICD-10-CM

## 2019-10-19 DIAGNOSIS — F39 Unspecified mood [affective] disorder: Secondary | ICD-10-CM | POA: Diagnosis not present

## 2019-10-19 DIAGNOSIS — L120 Bullous pemphigoid: Secondary | ICD-10-CM

## 2019-10-19 NOTE — Patient Instructions (Addendum)
Gentle Skin Care Guide  1. Bathe no more than once a day.  2. Avoid bathing in hot water  3. Use a mild soap like Dove, Vanicream, Cetaphil, CeraVe. Can use Lever 2000 or       Cetaphil antibacterial soap  4. Use soap only where you need it. On most days, use it under your arms, between       your legs, and on your fee. Let the water rinse other areas unless visibly dirty.  5. When you get out of the bath/shower, use a towel to gently blot your skin dry, don't           rub it.  6. While your skin is still a little damp, apply a moisturizing cream such as Vanicream,       CeraVe, Cetaphil, Eucerin, Sarna lotion or plain Vaseline Jelly. For hands apply        Neutrogena [place] Hand Cream or Excipial Hand Cream.  7. Reapply moisturizer any time you start to itch or feel dry.  8. Sometimes using free and clear laundry detergents can be helpful. Fabric softener       sheets should be avoided. Downy Free & Gentle liquid, or any liquid fabric softener       that is free of dyes and perfumes, it acceptable to use  9. If your doctor has given you prescription creams you may apply moisturizers over them   Doxycycline should be taken with food to prevent nausea. Do not lay down for 30 minutes after taking. Be cautious with sun exposure and use good sun protection while on this medication. Pregnant women should not take this medication.   Topical steroids (such as triamcinolone, fluocinolone, fluocinonide, mometasone, clobetasol, halobetasol, betamethasone, hydrocortisone) can cause thinning and lightening of the skin if they are used for too long in the same area. Your physician has selected the right strength medicine for your problem and area affected on the body. Please use your medication only as directed by your physician to prevent side effects.

## 2019-10-19 NOTE — Progress Notes (Signed)
   Follow-Up Visit   Subjective  Nichole Travis is a 86 y.o. female who presents for the following: Follow-up.  Patient here for 3 week follow up for bullous pemphigoid of the lower legs, feet. She tool 14 days of 10mg  of prednisone and is now on 5mg  x 14 days. Course should be complete 10/28/19. She is also using clobetasol lotion BID and taking doxycycline 100mg  BID.  The following portions of the chart were reviewed this encounter and updated as appropriate:  Tobacco  Allergies  Meds  Problems  Med Hx  Surg Hx  Fam Hx     Review of Systems:  No other skin or systemic complaints except as noted in HPI or Assessment and Plan.  Objective  Well appearing patient in no apparent distress; mood and affect are within normal limits.  A focused examination was performed including lower extremities, including the legs, feet, toes, and toenails and arms, chest, back. Relevant physical exam findings are noted in the Assessment and Plan.  Objective  Feet: Diffuse excoriated pink papules coalescing to plaques at back   Assessment & Plan  Bullous pemphigoid Feet  Chronic, improving, not at goal.   Unfortunately, most other treatments have a worse side effect profile and we definitely want to get Ms. Soutar off of the prednisone as she has had multiple steroid courses.   She currently is sleeping well and doing relatively well. Feet have healed very well. No new blisters.   Cont doxycycline 100mg  1 PO BID with food Cont clobetasol ointment BID prn to affected areas Cont prednisone 5mg  daily until finished with pills (approximately 1 more week)  Plan to start nbUVB 3x per week if family is able to transport her here for it and we can get approved with insurance.  Medically she is doing well and can go back to assisted living. If she develops many new blisters requiring more intensive wound care, it is possible she could need to return to the SNF in future.   Seborrheic Keratoses -  Stuck-on, waxy, tan-brown papules and plaques  - Discussed benign etiology and prognosis. - Observe - Call for any changes   Return in about 6 weeks (around 11/30/2019) for BP follow up.  Graciella Belton, RMA, am acting as scribe for Forest Gleason, MD .   Documentation: I have reviewed the above documentation for accuracy and completeness, and I agree with the above.  Forest Gleason, MD

## 2019-10-20 ENCOUNTER — Encounter: Payer: Self-pay | Admitting: Internal Medicine

## 2019-10-20 DIAGNOSIS — I10 Essential (primary) hypertension: Secondary | ICD-10-CM | POA: Insufficient documentation

## 2019-10-20 DIAGNOSIS — F0280 Dementia in other diseases classified elsewhere without behavioral disturbance: Secondary | ICD-10-CM | POA: Insufficient documentation

## 2019-10-20 DIAGNOSIS — G309 Alzheimer's disease, unspecified: Secondary | ICD-10-CM | POA: Insufficient documentation

## 2019-10-20 DIAGNOSIS — F39 Unspecified mood [affective] disorder: Secondary | ICD-10-CM | POA: Insufficient documentation

## 2019-10-20 DIAGNOSIS — E039 Hypothyroidism, unspecified: Secondary | ICD-10-CM | POA: Insufficient documentation

## 2019-10-24 ENCOUNTER — Other Ambulatory Visit: Payer: Self-pay

## 2019-10-24 ENCOUNTER — Ambulatory Visit (INDEPENDENT_AMBULATORY_CARE_PROVIDER_SITE_OTHER): Payer: Medicare Other

## 2019-10-24 DIAGNOSIS — L120 Bullous pemphigoid: Secondary | ICD-10-CM | POA: Diagnosis not present

## 2019-10-24 NOTE — Progress Notes (Signed)
Patient treated in the lightbox/NVUVB for 1 minute and 28 seconds.

## 2019-10-26 ENCOUNTER — Encounter: Payer: Self-pay | Admitting: Dermatology

## 2019-10-26 ENCOUNTER — Ambulatory Visit (INDEPENDENT_AMBULATORY_CARE_PROVIDER_SITE_OTHER): Payer: Medicare Other

## 2019-10-26 ENCOUNTER — Telehealth: Payer: Self-pay

## 2019-10-26 ENCOUNTER — Other Ambulatory Visit: Payer: Self-pay

## 2019-10-26 DIAGNOSIS — L120 Bullous pemphigoid: Secondary | ICD-10-CM

## 2019-10-26 NOTE — Telephone Encounter (Signed)
Unless she feels its urgent for a doctor to look at it, one of our docs can examine it next time she is in for a Light Treatment. (I assume that will be no later than 1 week?)

## 2019-10-26 NOTE — Telephone Encounter (Signed)
Dr. Laurence Ferrari has been treating this patient for Bullous Pemphigoid for several weeks. Dr. Laurence Ferrari has her coming in for lightbox/NBUVB now. While she was here today the daughter in law noticed a new place on her foot that is red and does not look well (she didn't say anything until after they were gone and she called).   Patient has just recently moved back to her room (assisted living) at [facility].

## 2019-10-26 NOTE — Progress Notes (Signed)
Patient treated in the lightbox/NBUVB for 1 minute and 38 seconds.

## 2019-10-26 NOTE — Telephone Encounter (Signed)
Patient's daughter in law has sent a photo of the blister through [place]. You will find this in her chart review under the "Patient Message". Please review.

## 2019-10-26 NOTE — Telephone Encounter (Signed)
Reviewed message and photo. It appears this is just a Bullous Pemphigoid blister.  It is not unusual to get new ones. Continue current treatment and one of the docs can see her when she is in for her next light treatments.  It does appear she may have tinea pedis too which can also cause blisters.  If it is determined she needs an anti-fungal when doctor evaluates her at next light treatment, that will be prescribed at that time.

## 2019-10-27 NOTE — Telephone Encounter (Signed)
Call patient's daughter in law Jamas Lav and spoke with her regarding information per Dr. Nehemiah Massed.  I also called Luellen Pucker at [facility] and advised her of information as well.   Dr. Nehemiah Massed will see patient in lightbox room on Monday at 2:15.

## 2019-10-30 ENCOUNTER — Ambulatory Visit (INDEPENDENT_AMBULATORY_CARE_PROVIDER_SITE_OTHER): Payer: Medicare Other | Admitting: Dermatology

## 2019-10-30 ENCOUNTER — Ambulatory Visit (INDEPENDENT_AMBULATORY_CARE_PROVIDER_SITE_OTHER): Payer: Medicare Other

## 2019-10-30 ENCOUNTER — Other Ambulatory Visit: Payer: Self-pay

## 2019-10-30 DIAGNOSIS — B353 Tinea pedis: Secondary | ICD-10-CM | POA: Diagnosis not present

## 2019-10-30 DIAGNOSIS — L120 Bullous pemphigoid: Secondary | ICD-10-CM

## 2019-10-30 MED ORDER — KETOCONAZOLE 2 % EX CREA: 1.0000 "application " | TOPICAL_CREAM | Freq: Every evening | CUTANEOUS | 0 refills | Status: DC

## 2019-10-30 MED ORDER — FLUCONAZOLE 200 MG PO TABS: 200.0000 mg | ORAL_TABLET | ORAL | 0 refills | Status: DC

## 2019-10-30 NOTE — Progress Notes (Signed)
Patient treated in the lightbox/NBUVB for 1 minute and 48 seconds.

## 2019-10-30 NOTE — Progress Notes (Signed)
   Follow-Up Visit   Subjective  Nichole Travis is a 86 y.o. female who presents for the following: Follow-up.  Patient here today for NVUVB. Saw Dr. Nehemiah Massed for a new blister on her foot.   The following portions of the chart were reviewed this encounter and updated as appropriate: Tobacco  Allergies  Meds  Problems  Med Hx  Surg Hx  Fam Hx      Review of Systems: No other skin or systemic complaints.  Objective  Well appearing patient in no apparent distress; mood and affect are within normal limits.  A focused examination was performed including feet. Relevant physical exam findings are noted in the Assessment and Plan.  Objective  Left Lateral Surface: Resolving bulla  Objective  Bilateral Feet: Pink scaliness  Assessment & Plan  Bullous pemphigoid Left Lateral Surface  Patient advised it will be fine until it heals.  Discontinue clobetasol cream and only use PRN blisters.   Tinea pedis of right foot Bilateral Feet  Start ketoconazole 2% cream to sides of feet and in between toes qhs  Start Diflucan 200mg  1 PO QD on Monday, Wednesday and Fridays for 4 weeks #12 0RF  Other Related Medications fluconazole (DIFLUCAN) 200 MG tablet ketoconazole (NIZORAL) 2 % cream  I, Johnsie Kindred, RMA, am acting as scribe for Sarina Ser, MD.   Return for as scheduled.   Documentation: I have reviewed the above documentation for accuracy and completeness, and I agree with the above.  Sarina Ser, MD

## 2019-10-30 NOTE — Progress Notes (Signed)
Error

## 2019-10-31 ENCOUNTER — Other Ambulatory Visit: Payer: Self-pay

## 2019-10-31 DIAGNOSIS — B353 Tinea pedis: Secondary | ICD-10-CM

## 2019-10-31 MED ORDER — FLUCONAZOLE 200 MG PO TABS
200.0000 mg | ORAL_TABLET | ORAL | 0 refills | Status: DC
Start: 2019-10-31 — End: 2019-12-06

## 2019-10-31 MED ORDER — KETOCONAZOLE 2 % EX CREA: 1.0000 "application " | TOPICAL_CREAM | Freq: Every evening | CUTANEOUS | 0 refills | Status: DC

## 2019-10-31 NOTE — Progress Notes (Signed)
Prescriptions sent into the wrong pharmacy.

## 2019-11-01 ENCOUNTER — Other Ambulatory Visit: Payer: Self-pay

## 2019-11-01 ENCOUNTER — Encounter: Payer: Self-pay | Admitting: Dermatology

## 2019-11-01 ENCOUNTER — Ambulatory Visit (INDEPENDENT_AMBULATORY_CARE_PROVIDER_SITE_OTHER): Payer: Medicare Other

## 2019-11-01 DIAGNOSIS — L120 Bullous pemphigoid: Secondary | ICD-10-CM

## 2019-11-01 NOTE — Progress Notes (Signed)
Patient treated in the lightbox/NBUVB for 1 minute and 58 seconds.

## 2019-11-06 ENCOUNTER — Ambulatory Visit (INDEPENDENT_AMBULATORY_CARE_PROVIDER_SITE_OTHER): Payer: Medicare Other

## 2019-11-06 ENCOUNTER — Ambulatory Visit: Payer: Medicare Other

## 2019-11-06 ENCOUNTER — Other Ambulatory Visit: Payer: Self-pay

## 2019-11-06 DIAGNOSIS — L120 Bullous pemphigoid: Secondary | ICD-10-CM | POA: Diagnosis not present

## 2019-11-06 NOTE — Progress Notes (Signed)
Patient treated in the lightbox/NBUVB for 2 minutes and 8 seconds.

## 2019-11-08 ENCOUNTER — Ambulatory Visit (INDEPENDENT_AMBULATORY_CARE_PROVIDER_SITE_OTHER): Payer: Medicare Other

## 2019-11-08 ENCOUNTER — Other Ambulatory Visit: Payer: Self-pay

## 2019-11-08 DIAGNOSIS — L120 Bullous pemphigoid: Secondary | ICD-10-CM | POA: Diagnosis not present

## 2019-11-08 NOTE — Progress Notes (Signed)
Patient treated in the lightbox/NBUVB for 2 minutes and 18 seconds.

## 2019-11-09 ENCOUNTER — Ambulatory Visit: Payer: Medicare Other | Admitting: Internal Medicine

## 2019-11-09 ENCOUNTER — Encounter: Payer: Self-pay | Admitting: Internal Medicine

## 2019-11-09 VITALS — BP 132/70 | HR 66 | Temp 98.1°F | Resp 16 | Wt 204.4 lb

## 2019-11-09 DIAGNOSIS — F39 Unspecified mood [affective] disorder: Secondary | ICD-10-CM | POA: Diagnosis not present

## 2019-11-09 DIAGNOSIS — I10 Essential (primary) hypertension: Secondary | ICD-10-CM | POA: Diagnosis not present

## 2019-11-09 DIAGNOSIS — G301 Alzheimer's disease with late onset: Secondary | ICD-10-CM | POA: Diagnosis not present

## 2019-11-09 DIAGNOSIS — E039 Hypothyroidism, unspecified: Secondary | ICD-10-CM

## 2019-11-09 DIAGNOSIS — L120 Bullous pemphigoid: Secondary | ICD-10-CM

## 2019-11-09 DIAGNOSIS — F0280 Dementia in other diseases classified elsewhere without behavioral disturbance: Secondary | ICD-10-CM

## 2019-11-09 NOTE — Addendum Note (Signed)
Addended by: Pilar Grammes on: 11/09/2019 04:50 PM   Modules accepted: Orders

## 2019-11-09 NOTE — Assessment & Plan Note (Signed)
Clinically euthyroid 

## 2019-11-09 NOTE — Assessment & Plan Note (Signed)
Very forgetful but function is stable in AL setting No Rx indicated

## 2019-11-09 NOTE — Assessment & Plan Note (Signed)
Improved but not quiet Still on doxy--and now on fluconazole per Dr Nehemiah Massed in case fungal infection is trigger

## 2019-11-09 NOTE — Assessment & Plan Note (Signed)
BP Readings from Last 3 Encounters:  11/09/19 132/70  08/27/19 (!) 159/109  05/19/16 118/74   Okay on amlodipine and metoprolol

## 2019-11-09 NOTE — Progress Notes (Signed)
Subjective:    Patient ID: Nichole Travis, female    DOB: [birth year], 86 y.o.   MRN: [id number]  HPI Visit in assisted living apartment for follow up of recent SNF stay Reviewed status with Luellen Pucker RN  Ongoing memory problems Didn't remember much of health care stay or my stating I would see her here  Has had a couple more blisters--but not bad Still on doxy and now fluconazole 3 days per week ---from Dr Nehemiah Massed Not really painful now--just sore  Walks with rollator Assist with showers--mostly stand by Generally continent  No chest pain No SOB No palpitations No dizziness or syncope Feet puffy but no sig edema now  Not depressed Continues on the citalopram  Current Outpatient Medications on File Prior to Visit  Medication Sig Dispense Refill  . acetaminophen (TYLENOL) 500 MG tablet Take 1,000 mg by mouth every 6 (six) hours as needed for moderate pain.    Marland Kitchen amLODipine (NORVASC) 10 MG tablet Take 10 mg by mouth daily.    Marland Kitchen aspirin 325 MG EC tablet Take 325 mg by mouth daily.    . ciclopirox (LOPROX) 0.77 % cream Apply     as directed use BID to bilateral feet, in between toes    . citalopram (CELEXA) 40 MG tablet Take 40 mg by mouth daily.    . clobetasol ointment (TEMOVATE) 0.05 % Apply  as directed    apply twice daily to blistering areas and very itchy areas on the body. avoid face, groin, axilla    . doxycycline (MONODOX) 100 MG capsule Take 100 mg by mouth 2 (two) times daily.    . fluconazole (DIFLUCAN) 200 MG tablet Take 1 tablet (200 mg total) by mouth as directed. Take 1 by mouth on Monday, Wednesday and Friday for four weeks. 12 tablet 0  . Glucosamine-Chondroitin (OSTEO BI-FLEX REGULAR STRENGTH) 250-200 MG TABS Take 2 tablets by mouth See admin instructions. Daily after meals    . hydrOXYzine (ATARAX/VISTARIL) 10 MG tablet Take 10 mg by mouth 3 times/day as needed-between meals & bedtime for itching.    Marland Kitchen ketoconazole (NIZORAL) 2 % cream Apply 1 application topically  at bedtime. To sides of feet and between toes. 60 g 0  . levothyroxine (SYNTHROID, LEVOTHROID) 50 MCG tablet Take 50 mcg by mouth daily before breakfast.    . losartan (COZAAR) 25 MG tablet Take 25 mg by mouth daily.    . metoprolol succinate (TOPROL-XL) 50 MG 24 hr tablet Take 50 mg by mouth daily. Take with or immediately following a meal.    . Multiple Vitamin (MULTIVITAMIN WITH MINERALS) TABS tablet Take 1 tablet by mouth daily.    . Multiple Vitamins-Minerals (ONE-A-DAY 50 PLUS PO) Take 1 tablet by mouth daily.    . mupirocin ointment (BACTROBAN) 2 % Apply  a small amount to affected area as directed  2-3 times per day to open wounds on feet (twice is fine but reapply again if she has soaked her feet)    . triamcinolone ointment (KENALOG) 0.1 % Apply  a small amount to affected area twice a day  PRN itch. Avoid Face/Groin/Underarms     No current facility-administered medications on file prior to visit.    Allergies  Allergen Reactions  . Penicillins Diarrhea    Bloody diarrhea  . Sulfa Antibiotics     Past Medical History:  Diagnosis Date  . Alzheimer disease ([place])   . Bullous pemphigoid 08/23/2019   Bx proven  . Hypertension   .  Hypothyroidism   . Mood disorder ([place])    mostly anxiety    Past Surgical History:  Procedure Laterality Date  . ABDOMINAL HYSTERECTOMY    . APPENDECTOMY    . BACK SURGERY    . KNEE SURGERY Bilateral    TKR    Family History  Problem Relation Age of Onset  . Colon cancer Mother     Social History   Socioeconomic History  . Marital status: Widowed    Spouse name: Not on file  . Number of children: 2  . Years of education: Not on file  . Highest education level: Not on file  Occupational History  . Occupation: Secretary--retired  Tobacco Use  . Smoking status: Never Smoker  . Smokeless tobacco: Never Used  Substance and Sexual Activity  . Alcohol use: Not Currently    Alcohol/week: 2.0 standard drinks    Types: 2 Glasses of  wine per week  . Drug use: No  . Sexual activity: Not on file  Other Topics Concern  . Not on file  Social History Narrative   1 daughter, 1 son      Not sure about living will   Son Jaci Standard is [facility] POA   Has DNR   Prefers no hospital   Not sure about tube feeds   Social Determinants of Health   Financial Resource Strain:   . Difficulty of Paying Living Expenses:   Food Insecurity:   . Worried About Charity fundraiser in the Last Year:   . Arboriculturist in the Last Year:   Transportation Needs:   . Film/video editor (Medical):   Marland Kitchen Lack of Transportation (Non-Medical):   Physical Activity:   . Days of Exercise per Week:   . Minutes of Exercise per Session:   Stress:   . Feeling of Stress :   Social Connections:   . Frequency of Communication with Friends and Family:   . Frequency of Social Gatherings with Friends and Family:   . Attends Religious Services:   . Active Member of Clubs or Organizations:   . Attends Archivist Meetings:   Marland Kitchen Marital Status:   Intimate Partner Violence:   . Fear of Current or Ex-Partner:   . Emotionally Abused:   Marland Kitchen Physically Abused:   . Sexually Abused:    Review of Systems  Appetite is good Weight stable Sleeps well Bowels are fine     Objective:   Physical Exam  Constitutional: She appears well-developed. No distress.  Neck: No thyromegaly present.  Cardiovascular: Normal rate, regular rhythm and normal heart sounds. Exam reveals no gallop.  No murmur heard. Respiratory: Effort normal and breath sounds normal. No respiratory distress. She has no wheezes. She has no rales.  GI: Soft. There is no abdominal tenderness.  Musculoskeletal:     Comments: Slight pedal puffiness Feet are sensitive  Lymphadenopathy:    She has no cervical adenopathy.  Skin:  1 ruptured blister on left foot---otherwise much improved  Psychiatric:  Upbeat Constantly repeating herself Doesn't remember the plan for me to be her doctor             Assessment & Plan:

## 2019-11-09 NOTE — Assessment & Plan Note (Signed)
Not really depressed Anxiety is less evident Will try decreasing the citalopram to 10mg  daily

## 2019-11-14 ENCOUNTER — Other Ambulatory Visit: Payer: Self-pay

## 2019-11-14 ENCOUNTER — Ambulatory Visit (INDEPENDENT_AMBULATORY_CARE_PROVIDER_SITE_OTHER): Payer: Medicare Other

## 2019-11-14 DIAGNOSIS — L120 Bullous pemphigoid: Secondary | ICD-10-CM

## 2019-11-14 NOTE — Progress Notes (Signed)
Patient treated in the lightbox/NBUVB for 2 minutes and 28 seconds.

## 2019-11-16 ENCOUNTER — Telehealth: Payer: Self-pay

## 2019-11-16 ENCOUNTER — Ambulatory Visit (INDEPENDENT_AMBULATORY_CARE_PROVIDER_SITE_OTHER): Payer: Medicare Other

## 2019-11-16 ENCOUNTER — Ambulatory Visit (INDEPENDENT_AMBULATORY_CARE_PROVIDER_SITE_OTHER): Payer: Medicare Other | Admitting: Dermatology

## 2019-11-16 ENCOUNTER — Other Ambulatory Visit: Payer: Self-pay

## 2019-11-16 DIAGNOSIS — B353 Tinea pedis: Secondary | ICD-10-CM | POA: Diagnosis not present

## 2019-11-16 DIAGNOSIS — L120 Bullous pemphigoid: Secondary | ICD-10-CM

## 2019-11-16 DIAGNOSIS — M79673 Pain in unspecified foot: Secondary | ICD-10-CM | POA: Diagnosis not present

## 2019-11-16 MED ORDER — PREDNISONE 20 MG PO TABS: ORAL_TABLET | ORAL | 0 refills | Status: DC

## 2019-11-16 MED ORDER — DOXYCYCLINE MONOHYDRATE 100 MG PO CAPS: 100.0000 mg | ORAL_CAPSULE | Freq: Two times a day (BID) | ORAL | 6 refills | Status: AC

## 2019-11-16 NOTE — Progress Notes (Signed)
Patient treated in the lightbox/NBUVB for 2 minutes and 38 seconds.

## 2019-11-16 NOTE — Telephone Encounter (Signed)
Nichole Travis from [facility] called and just wanted you to know that patient has been on Doxycyline 100mg  BID since February. They will continue per your orders today.

## 2019-11-16 NOTE — Patient Instructions (Signed)
Recommend daily broad spectrum sunscreen SPF 30+ to sun-exposed areas, reapply every 2 hours as needed. Call for new or changing lesions.   You can use all creams i.e.: clobetasol and Voltaren around all open wound and blistered areas. Only use Mupirocin on the open wounds

## 2019-11-16 NOTE — Telephone Encounter (Signed)
Daughter in law came in to the office this afternoon. We have discussed everything. Please see today's office note.

## 2019-11-16 NOTE — Progress Notes (Signed)
   Follow-Up Visit   Subjective  Nichole Travis is a 86 y.o. female who presents for the following: Bullous Pemphigoid (Patient has a new blister on left inner thigh, and on left inner heel).   The following portions of the chart were reviewed this encounter and updated as appropriate:  Tobacco  Allergies  Meds  Problems  Med Hx  Surg Hx  Fam Hx      Review of Systems:  No other skin or systemic complaints except as noted in HPI or Assessment and Plan.  Objective  Well appearing patient in no apparent distress; mood and affect are within normal limits.  A focused examination was performed including lower extremities, including the legs, feet, toes, and toenails and face, neck, chest and back. Relevant physical exam findings are noted in the Assessment and Plan.  Objective  Left Thigh, and left heel: Pink spotty macules over back and legs  Images                                            Objective  Right Foot - Anterior: Scaling and maceration web spaces and over distal and lateral soles.    Assessment & Plan  Bullous pemphigoid-severe and generalized not controlled on current treatment with doxycycline and light treatment and topical steroids.  Recent flare with red spots.  And blisters. Pain of the heel at a blister spot.  Left Thigh, and left heel  Patient can  spot treat Voltaren for pain, apply around the blistered areas Contiue light box treatment  Continue Doxycycline 100mg  bid with food #60 6rf Recommend  PCP continue to screen for internal malignancies Start Prednisone 20 mg q M,W,F,S  Apply all creams i.e.: clobetasol and Voltaren around wounds and blistered areas, and use Mupirocin on the open wounds.  predniSONE (DELTASONE) 20 MG tablet - Left Thigh, and left heel  TINEA PEDIS - FUNGAL INFECTION OF FOOT. Right Foot - Anterior Improving. Continue to take Fluconazole 200 mg as previously prescribed till finished  with medication.  Return for As Scheduled.  Marene Lenz, CMA, am acting as scribe for Sarina Ser, MD . Documentation: I have reviewed the above documentation for accuracy and completeness, and I agree with the above.  Sarina Ser, MD

## 2019-11-19 ENCOUNTER — Encounter: Payer: Self-pay | Admitting: Dermatology

## 2019-11-20 ENCOUNTER — Ambulatory Visit (INDEPENDENT_AMBULATORY_CARE_PROVIDER_SITE_OTHER): Payer: Medicare Other

## 2019-11-20 ENCOUNTER — Other Ambulatory Visit: Payer: Self-pay

## 2019-11-20 DIAGNOSIS — L120 Bullous pemphigoid: Secondary | ICD-10-CM | POA: Diagnosis not present

## 2019-11-20 NOTE — Progress Notes (Signed)
Patient treated in the lightbox/NBUVB for 2 minutes and 48 seconds.

## 2019-11-22 ENCOUNTER — Other Ambulatory Visit: Payer: Self-pay

## 2019-11-22 ENCOUNTER — Ambulatory Visit (INDEPENDENT_AMBULATORY_CARE_PROVIDER_SITE_OTHER): Payer: Medicare Other

## 2019-11-22 DIAGNOSIS — L120 Bullous pemphigoid: Secondary | ICD-10-CM

## 2019-11-22 NOTE — Progress Notes (Signed)
Patient treated in the lightbox/NBUVB for 2 minutes and 58 seconds.

## 2019-11-28 ENCOUNTER — Ambulatory Visit (INDEPENDENT_AMBULATORY_CARE_PROVIDER_SITE_OTHER): Payer: Medicare Other

## 2019-11-28 ENCOUNTER — Other Ambulatory Visit: Payer: Self-pay

## 2019-11-28 DIAGNOSIS — L120 Bullous pemphigoid: Secondary | ICD-10-CM | POA: Diagnosis not present

## 2019-11-28 NOTE — Progress Notes (Signed)
Patient treated in the lightbox/NBUVB for 3 minutes and 08 seconds.

## 2019-11-29 ENCOUNTER — Ambulatory Visit: Payer: Medicare Other | Admitting: Dermatology

## 2019-11-30 ENCOUNTER — Ambulatory Visit: Payer: Medicare Other

## 2019-11-30 ENCOUNTER — Other Ambulatory Visit: Payer: Self-pay

## 2019-11-30 ENCOUNTER — Ambulatory Visit (INDEPENDENT_AMBULATORY_CARE_PROVIDER_SITE_OTHER): Payer: Medicare Other

## 2019-11-30 DIAGNOSIS — L120 Bullous pemphigoid: Secondary | ICD-10-CM | POA: Diagnosis not present

## 2019-11-30 NOTE — Progress Notes (Signed)
Patient treated for 3:18 in light box today.

## 2019-12-06 ENCOUNTER — Ambulatory Visit (INDEPENDENT_AMBULATORY_CARE_PROVIDER_SITE_OTHER): Payer: Medicare Other | Admitting: Dermatology

## 2019-12-06 ENCOUNTER — Other Ambulatory Visit: Payer: Self-pay

## 2019-12-06 DIAGNOSIS — L120 Bullous pemphigoid: Secondary | ICD-10-CM | POA: Diagnosis not present

## 2019-12-06 DIAGNOSIS — B353 Tinea pedis: Secondary | ICD-10-CM | POA: Diagnosis not present

## 2019-12-06 MED ORDER — PREDNISONE 20 MG PO TABS: ORAL_TABLET | ORAL | 1 refills | Status: AC

## 2019-12-06 NOTE — Progress Notes (Signed)
   Follow-Up Visit   Subjective  Nichole Travis is a 86 y.o. female who presents for the following: Bullous pemphigoid (new blister on the L plantar foot, 1 month f/u ). Pt c/o itchy skin on her back and painful blister on her Left foot.  Her feet tend to be painful and she complains about it constantly.  They have used Voltaren cream which has helped. Daughter in law with pt and she provides history.  The following portions of the chart were reviewed this encounter and updated as appropriate:  Tobacco  Allergies  Meds  Problems  Med Hx  Surg Hx  Fam Hx      Review of Systems:  No other skin or systemic complaints except as noted in HPI or Assessment and Plan.  Objective  Well appearing patient in no apparent distress; mood and affect are within normal limits.  A focused examination was performed including lower legs, feet, arms . Relevant physical exam findings are noted in the Assessment and Plan.  Objective  Left Foot - Anterior: Erosions on the left foot   Objective  Right Foot - Anterior: Scaling and maceration web spaces and over distal and lateral soles.    Assessment & Plan  Bullous pemphigoid Left Foot - Anterior  Bullous pemphigoid-severe and generalized not controlled on current treatment with doxycycline and light treatment and topical steroids.  Recent flare with red spots.  And blisters. Pain of the heel at a blister spot. Mostly contained to the Left heel.   Patient can  spot treat Voltaren for pain, apply around the blistered areas Mupirocin on open sores. Continue Doxycycline 100mg  bid with food #60 6rf -doxycycline is anti-inflammatory treatment for her bullous pemphigoid.   Recommend  PCP continue to screen for internal malignancies can be associated with bullous pemphigoid. Increase prednisone to 40 mg q M,W,F,S Side effects from taking Prednisone discussed.  We are trying to do every other day dosing to decrease side effects. D/C Light box /NBUVB  treatments for now (I believe that Dr. Laurence Ferrari had initiated this to help decrease the itch associated with her bullous pemphigoid.)  We discussed with the patient and daughter-in-law that the patient is miserable and despite the side effects of systemic steroids, the patient is 86 year old and we want to improve the quality of life for her.  Apply all creams i.e.: clobetasol and Voltaren around wounds and blistered areas, and use Mupirocin on the open wounds.      Tinea pedis of right foot Right Foot - Anterior  Cont Diflucan 200 mg take 1 tablet on Monday, Wednesday, Friday as directed  Cont Ketoconazole cream apply to feet at bedtime   Return in about 4 weeks (around 01/03/2020). IMarye Round, CMA, am acting as scribe for Sarina Ser, MD .  Documentation: I have reviewed the above documentation for accuracy and completeness, and I agree with the above.  Sarina Ser, MD

## 2019-12-12 ENCOUNTER — Encounter: Payer: Self-pay | Admitting: Dermatology

## 2020-01-10 ENCOUNTER — Ambulatory Visit: Payer: Medicare Other | Admitting: Dermatology

## 2020-08-22 DIAGNOSIS — I10 Essential (primary) hypertension: Secondary | ICD-10-CM | POA: Diagnosis not present

## 2020-08-22 DIAGNOSIS — L120 Bullous pemphigoid: Secondary | ICD-10-CM | POA: Diagnosis not present

## 2020-08-22 DIAGNOSIS — E039 Hypothyroidism, unspecified: Secondary | ICD-10-CM

## 2020-08-22 DIAGNOSIS — F39 Unspecified mood [affective] disorder: Secondary | ICD-10-CM | POA: Diagnosis not present

## 2020-08-22 DIAGNOSIS — G301 Alzheimer's disease with late onset: Secondary | ICD-10-CM | POA: Diagnosis not present

## 2020-08-30 DIAGNOSIS — H6120 Impacted cerumen, unspecified ear: Secondary | ICD-10-CM

## 2020-09-12 DIAGNOSIS — I10 Essential (primary) hypertension: Secondary | ICD-10-CM | POA: Diagnosis not present

## 2020-09-12 DIAGNOSIS — G301 Alzheimer's disease with late onset: Secondary | ICD-10-CM | POA: Diagnosis not present

## 2020-09-12 DIAGNOSIS — L120 Bullous pemphigoid: Secondary | ICD-10-CM | POA: Diagnosis not present

## 2020-09-12 DIAGNOSIS — F39 Unspecified mood [affective] disorder: Secondary | ICD-10-CM | POA: Diagnosis not present

## 2020-10-18 DIAGNOSIS — F39 Unspecified mood [affective] disorder: Secondary | ICD-10-CM | POA: Diagnosis not present

## 2020-10-23 DIAGNOSIS — F39 Unspecified mood [affective] disorder: Secondary | ICD-10-CM | POA: Diagnosis not present

## 2020-10-23 DIAGNOSIS — I10 Essential (primary) hypertension: Secondary | ICD-10-CM | POA: Diagnosis not present

## 2020-10-23 DIAGNOSIS — G309 Alzheimer's disease, unspecified: Secondary | ICD-10-CM | POA: Diagnosis not present

## 2020-10-23 DIAGNOSIS — E039 Hypothyroidism, unspecified: Secondary | ICD-10-CM | POA: Diagnosis not present

## 2020-10-23 DIAGNOSIS — L120 Bullous pemphigoid: Secondary | ICD-10-CM

## 2020-11-04 DIAGNOSIS — E1159 Type 2 diabetes mellitus with other circulatory complications: Secondary | ICD-10-CM | POA: Diagnosis not present

## 2020-11-18 DIAGNOSIS — E1159 Type 2 diabetes mellitus with other circulatory complications: Secondary | ICD-10-CM | POA: Diagnosis not present

## 2020-11-18 DIAGNOSIS — L120 Bullous pemphigoid: Secondary | ICD-10-CM | POA: Diagnosis not present

## 2020-11-18 DIAGNOSIS — I10 Essential (primary) hypertension: Secondary | ICD-10-CM | POA: Diagnosis not present

## 2020-11-18 DIAGNOSIS — G301 Alzheimer's disease with late onset: Secondary | ICD-10-CM | POA: Diagnosis not present

## 2020-11-18 DIAGNOSIS — F39 Unspecified mood [affective] disorder: Secondary | ICD-10-CM

## 2020-11-27 DIAGNOSIS — H6123 Impacted cerumen, bilateral: Secondary | ICD-10-CM | POA: Diagnosis not present

## 2021-01-06 LAB — HM DIABETES EYE EXAM

## 2021-01-10 DIAGNOSIS — G309 Alzheimer's disease, unspecified: Secondary | ICD-10-CM

## 2021-01-10 DIAGNOSIS — L120 Bullous pemphigoid: Secondary | ICD-10-CM

## 2021-01-10 DIAGNOSIS — F39 Unspecified mood [affective] disorder: Secondary | ICD-10-CM

## 2021-01-10 DIAGNOSIS — E039 Hypothyroidism, unspecified: Secondary | ICD-10-CM

## 2021-01-10 DIAGNOSIS — I10 Essential (primary) hypertension: Secondary | ICD-10-CM

## 2021-01-10 DIAGNOSIS — E1159 Type 2 diabetes mellitus with other circulatory complications: Secondary | ICD-10-CM

## 2021-03-20 DIAGNOSIS — E1159 Type 2 diabetes mellitus with other circulatory complications: Secondary | ICD-10-CM

## 2021-03-20 DIAGNOSIS — I10 Essential (primary) hypertension: Secondary | ICD-10-CM

## 2021-03-20 DIAGNOSIS — N1831 Chronic kidney disease, stage 3a: Secondary | ICD-10-CM

## 2021-03-20 DIAGNOSIS — F39 Unspecified mood [affective] disorder: Secondary | ICD-10-CM

## 2021-03-20 DIAGNOSIS — G301 Alzheimer's disease with late onset: Secondary | ICD-10-CM

## 2021-04-21 DIAGNOSIS — L03116 Cellulitis of left lower limb: Secondary | ICD-10-CM | POA: Diagnosis not present

## 2021-05-09 DIAGNOSIS — N183 Chronic kidney disease, stage 3 unspecified: Secondary | ICD-10-CM

## 2021-05-09 DIAGNOSIS — E1159 Type 2 diabetes mellitus with other circulatory complications: Secondary | ICD-10-CM

## 2021-05-09 DIAGNOSIS — I10 Essential (primary) hypertension: Secondary | ICD-10-CM

## 2021-05-09 DIAGNOSIS — G309 Alzheimer's disease, unspecified: Secondary | ICD-10-CM

## 2021-05-09 DIAGNOSIS — L120 Bullous pemphigoid: Secondary | ICD-10-CM

## 2021-05-09 DIAGNOSIS — E079 Disorder of thyroid, unspecified: Secondary | ICD-10-CM

## 2021-05-09 DIAGNOSIS — F39 Unspecified mood [affective] disorder: Secondary | ICD-10-CM

## 2021-05-27 DIAGNOSIS — S80822A Blister (nonthermal), left lower leg, initial encounter: Secondary | ICD-10-CM | POA: Diagnosis not present

## 2021-06-02 DIAGNOSIS — L03116 Cellulitis of left lower limb: Secondary | ICD-10-CM | POA: Diagnosis not present

## 2021-06-25 DIAGNOSIS — E1159 Type 2 diabetes mellitus with other circulatory complications: Secondary | ICD-10-CM | POA: Diagnosis not present

## 2021-06-25 DIAGNOSIS — S81812A Laceration without foreign body, left lower leg, initial encounter: Secondary | ICD-10-CM | POA: Diagnosis not present

## 2021-06-25 DIAGNOSIS — I87311 Chronic venous hypertension (idiopathic) with ulcer of right lower extremity: Secondary | ICD-10-CM | POA: Diagnosis not present

## 2021-06-25 DIAGNOSIS — I872 Venous insufficiency (chronic) (peripheral): Secondary | ICD-10-CM | POA: Diagnosis not present

## 2021-07-14 ENCOUNTER — Encounter: Payer: Self-pay | Admitting: Internal Medicine

## 2021-07-15 DIAGNOSIS — I872 Venous insufficiency (chronic) (peripheral): Secondary | ICD-10-CM

## 2021-07-15 DIAGNOSIS — N1832 Chronic kidney disease, stage 3b: Secondary | ICD-10-CM | POA: Diagnosis not present

## 2021-07-15 DIAGNOSIS — F39 Unspecified mood [affective] disorder: Secondary | ICD-10-CM

## 2021-07-15 DIAGNOSIS — E1159 Type 2 diabetes mellitus with other circulatory complications: Secondary | ICD-10-CM | POA: Diagnosis not present

## 2021-07-15 DIAGNOSIS — L120 Bullous pemphigoid: Secondary | ICD-10-CM

## 2021-07-15 DIAGNOSIS — G301 Alzheimer's disease with late onset: Secondary | ICD-10-CM | POA: Diagnosis not present

## 2021-07-15 DIAGNOSIS — E038 Other specified hypothyroidism: Secondary | ICD-10-CM

## 2021-07-15 DIAGNOSIS — M810 Age-related osteoporosis without current pathological fracture: Secondary | ICD-10-CM

## 2021-07-15 DIAGNOSIS — I10 Essential (primary) hypertension: Secondary | ICD-10-CM | POA: Diagnosis not present

## 2021-08-07 DIAGNOSIS — L03116 Cellulitis of left lower limb: Secondary | ICD-10-CM | POA: Diagnosis not present

## 2021-08-11 DIAGNOSIS — L97919 Non-pressure chronic ulcer of unspecified part of right lower leg with unspecified severity: Secondary | ICD-10-CM | POA: Diagnosis not present

## 2021-09-03 DIAGNOSIS — I872 Venous insufficiency (chronic) (peripheral): Secondary | ICD-10-CM | POA: Diagnosis not present

## 2021-09-03 DIAGNOSIS — I87313 Chronic venous hypertension (idiopathic) with ulcer of bilateral lower extremity: Secondary | ICD-10-CM | POA: Diagnosis not present

## 2021-09-15 IMAGING — CT CT HEAD W/O CM
4 series · 16 of 47 positions shown, 18 images · non-contrast
Comparison: CT head 09/02/2011

CLINICAL DATA: Fall. Head trauma

EXAM:
CT HEAD WITHOUT CONTRAST
TECHNIQUE: Contiguous axial images were obtained from the base of the skull
through the vertex without intravenous contrast.

[Series 2: head wo · axial · 0.47mm/px · z∈[+38,+133]mm · 7 of 27 slices shown, 9 images]
[im 4/27  brain]
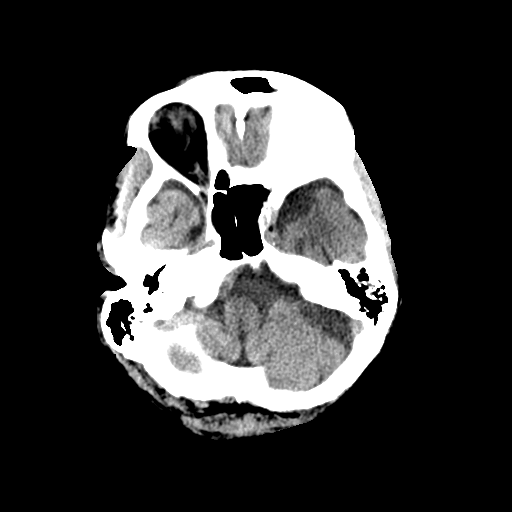
[im 4/27  bone]
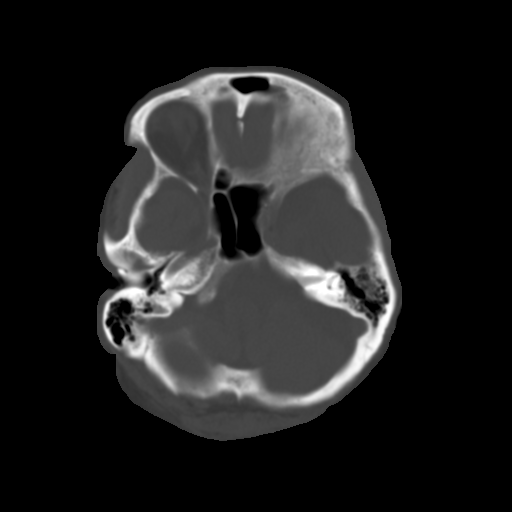
[im 7/27  brain]
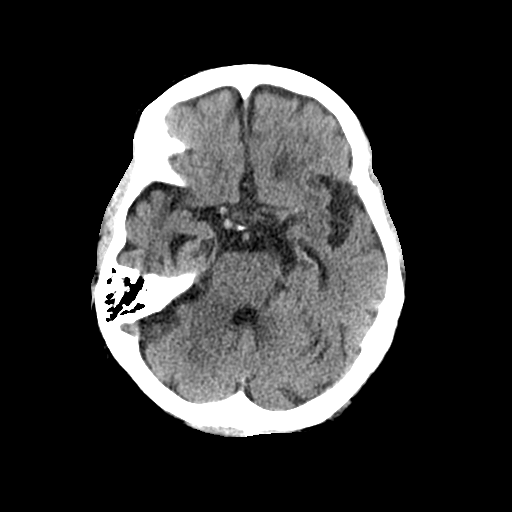
[im 10/27  brain]
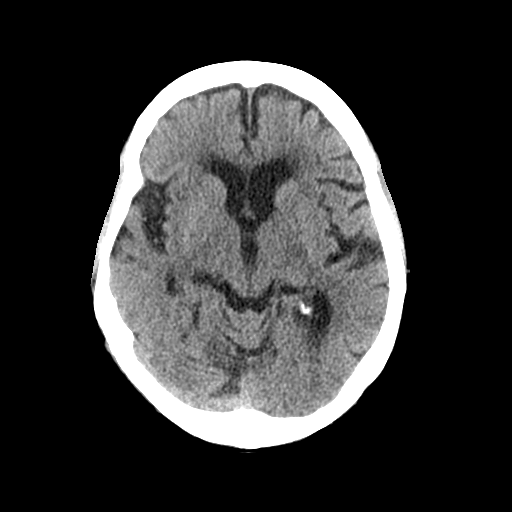
[im 14/27  brain]
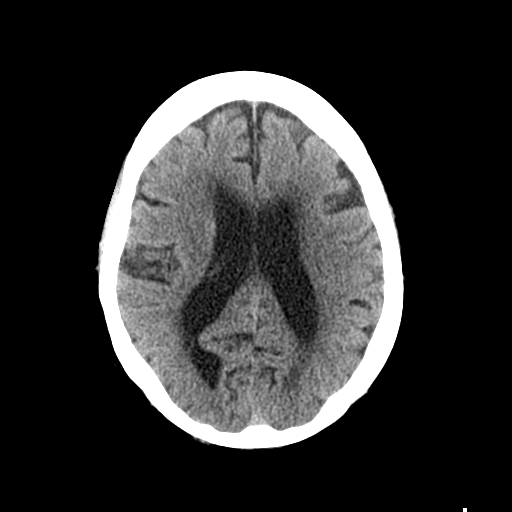
[im 17/27  brain]
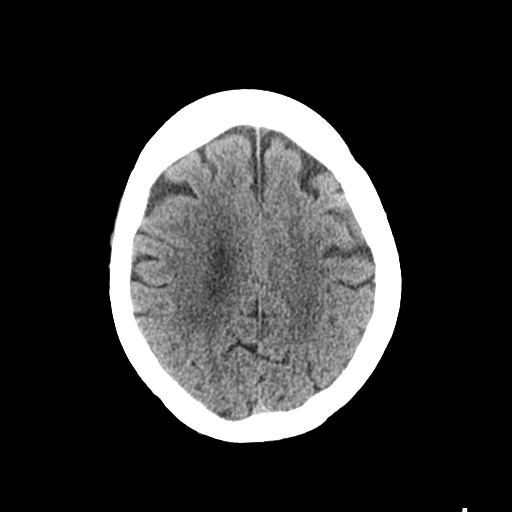
[im 17/27  bone]
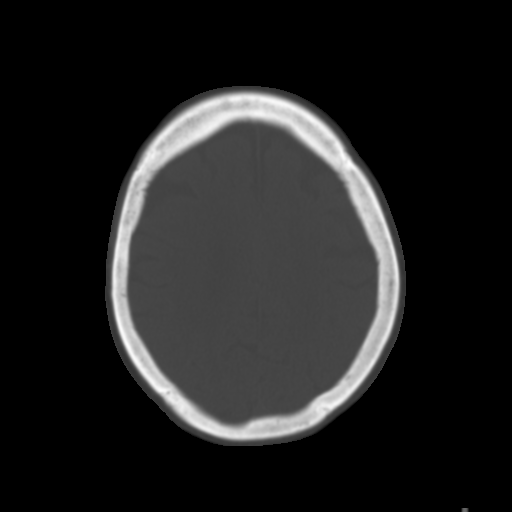
[im 20/27  brain]
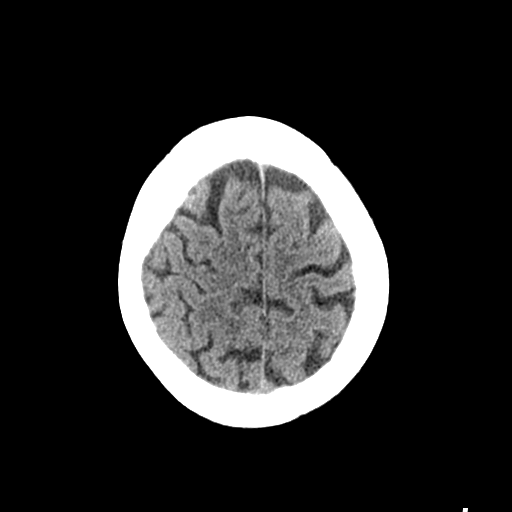
[im 23/27  brain]
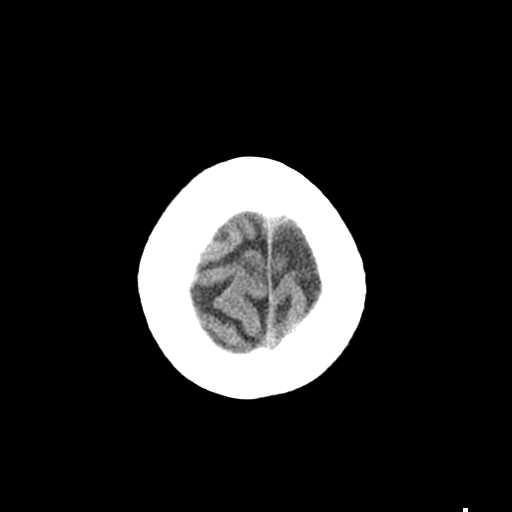

[Series 3: head bone · axial · 0.47mm/px · z∈[+35,+63]mm · 3 of 68 slices shown]
[im 7/68  bone]
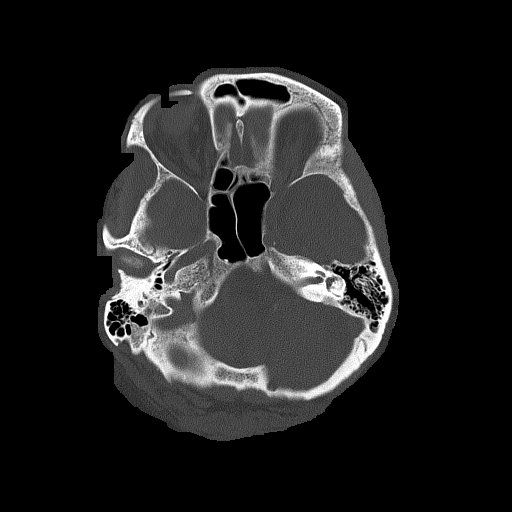
[im 14/68  bone]
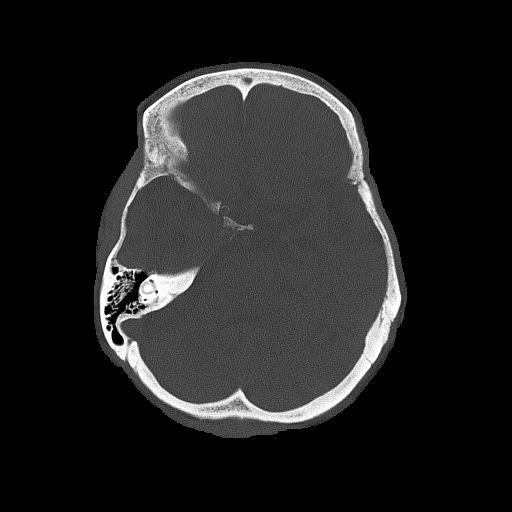
[im 21/68  bone]
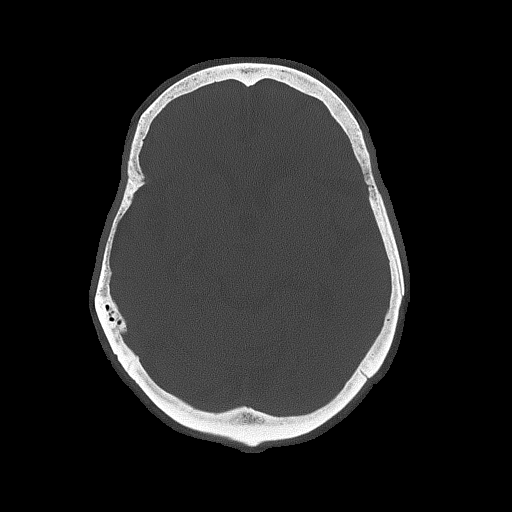

[Series 4: coronal soft tissue · coronal · 0.28mm/px · 3 of 63 slices shown]
[im 21/63  brain]
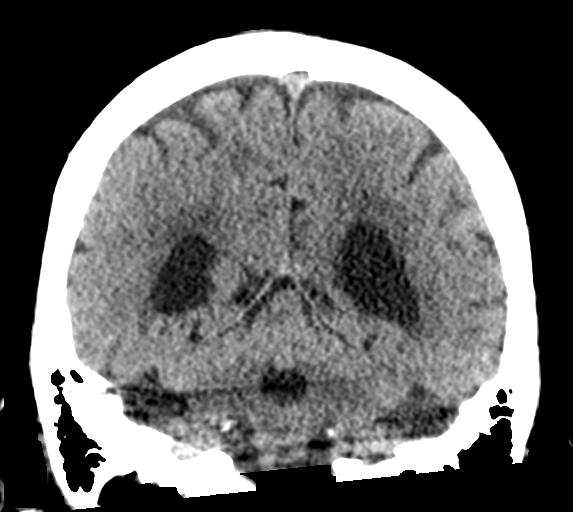
[im 28/63  brain]
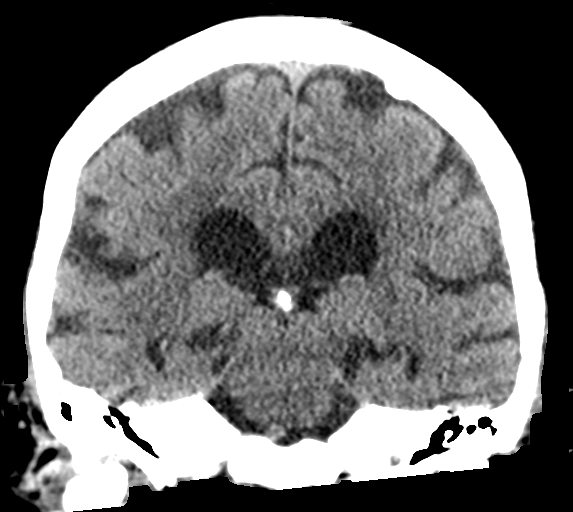
[im 35/63  brain]
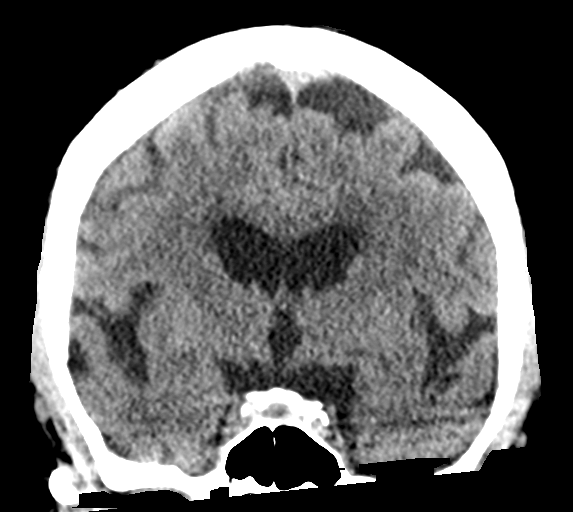

[Series 5: sagittal soft tissue · sagittal · 0.28mm/px · 3 of 53 slices shown]
[im 18/53  brain]
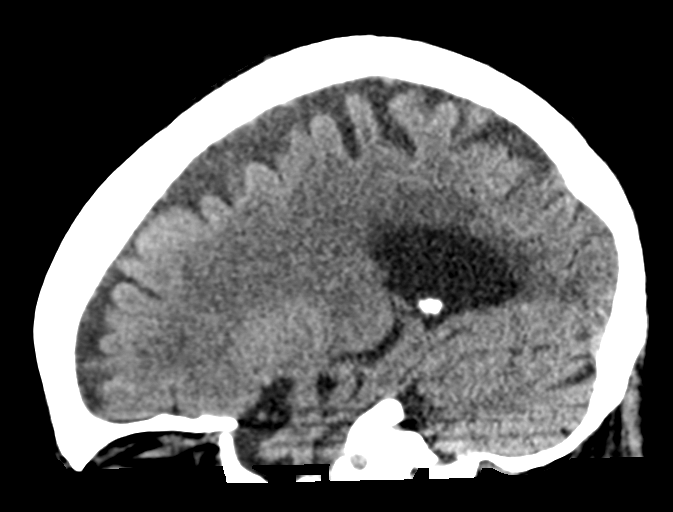
[im 27/53  brain]
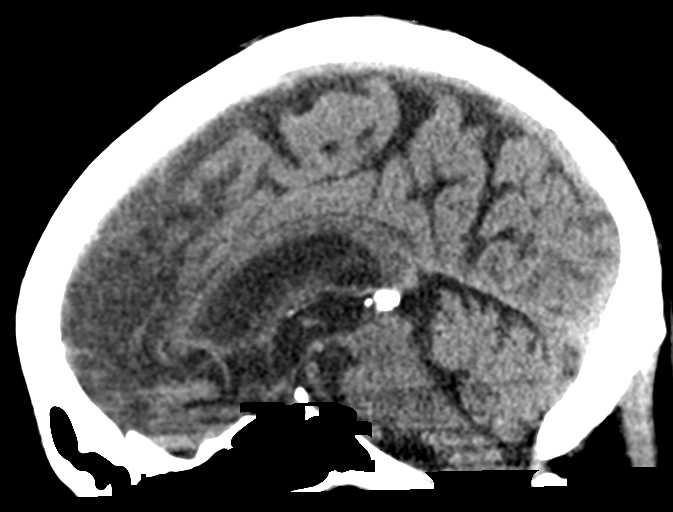
[im 35/53  brain]
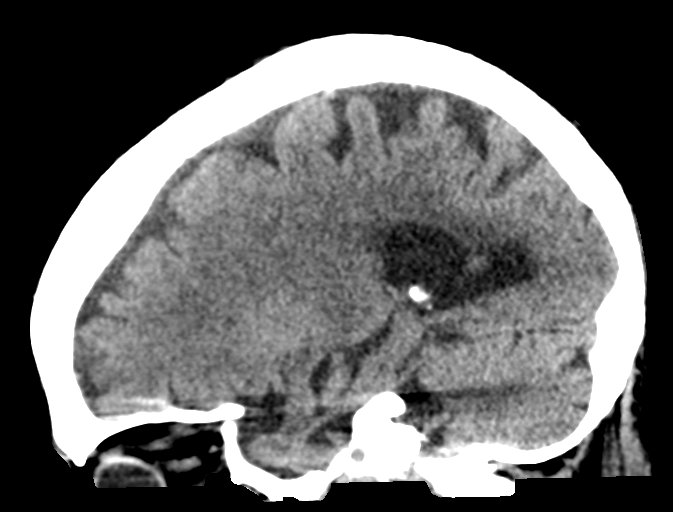

[16 of 47 positions shown; findings below may reference images not displayed]

FINDINGS: Brain: Generalized atrophy with interval progression. Chronic
microvascular ischemic changes in the white matter, with
progression.

Negative for acute infarct, hemorrhage, mass.

Vascular: Negative for hyperdense vessel

Skull: Negative

Sinuses/Orbits: Negative

Other: None
IMPRESSION: No acute abnormality.

Progressive atrophy and chronic microvascular ischemia since [DATE]

## 2021-09-17 DIAGNOSIS — E039 Hypothyroidism, unspecified: Secondary | ICD-10-CM

## 2021-09-17 DIAGNOSIS — E1159 Type 2 diabetes mellitus with other circulatory complications: Secondary | ICD-10-CM | POA: Diagnosis not present

## 2021-09-17 DIAGNOSIS — M1990 Unspecified osteoarthritis, unspecified site: Secondary | ICD-10-CM

## 2021-09-17 DIAGNOSIS — I872 Venous insufficiency (chronic) (peripheral): Secondary | ICD-10-CM

## 2021-09-17 DIAGNOSIS — L129 Pemphigoid, unspecified: Secondary | ICD-10-CM

## 2021-09-17 DIAGNOSIS — I10 Essential (primary) hypertension: Secondary | ICD-10-CM | POA: Diagnosis not present

## 2021-09-17 DIAGNOSIS — N183 Chronic kidney disease, stage 3 unspecified: Secondary | ICD-10-CM | POA: Diagnosis not present

## 2021-09-17 DIAGNOSIS — G309 Alzheimer's disease, unspecified: Secondary | ICD-10-CM | POA: Diagnosis not present

## 2021-09-17 DIAGNOSIS — F39 Unspecified mood [affective] disorder: Secondary | ICD-10-CM

## 2021-09-21 ENCOUNTER — Other Ambulatory Visit: Payer: Self-pay

## 2021-09-21 ENCOUNTER — Emergency Department
Admission: EM | Admit: 2021-09-21 | Discharge: 2021-09-21 | Disposition: A | Discharge status: Skilled Nursing Facility | Payer: Medicare Other | Attending: Emergency Medicine | Admitting: Emergency Medicine

## 2021-09-21 DIAGNOSIS — S0990XA Unspecified injury of head, initial encounter: Secondary | ICD-10-CM | POA: Diagnosis present

## 2021-09-21 DIAGNOSIS — W19XXXA Unspecified fall, initial encounter: Secondary | ICD-10-CM

## 2021-09-21 DIAGNOSIS — S0181XA Laceration without foreign body of other part of head, initial encounter: Secondary | ICD-10-CM | POA: Diagnosis not present

## 2021-09-21 DIAGNOSIS — F0390 Unspecified dementia without behavioral disturbance: Secondary | ICD-10-CM | POA: Diagnosis not present

## 2021-09-21 DIAGNOSIS — I10 Essential (primary) hypertension: Secondary | ICD-10-CM | POA: Diagnosis not present

## 2021-09-21 DIAGNOSIS — W01198A Fall on same level from slipping, tripping and stumbling with subsequent striking against other object, initial encounter: Secondary | ICD-10-CM | POA: Insufficient documentation

## 2021-09-21 NOTE — ED Provider Notes (Signed)
? ?[facility] ?Provider Note ? ? ? Event Date/Time  ? First MD Initiated Contact with Patient 09/21/21 0559   ?  (approximate) ? ? ?History  ? ?Fall and Laceration ? ?Level 5 caveat: The patient has dementia at baseline and is unable to provide history. ? ?HPI ? ?Nichole Travis is a 88 y.o. female with documented dementia, hypertension, and other extensive chronic medical issues.  She comes from her nursing facility after sliding out of her chair and reportedly hitting her head with a skin tear/laceration to her left forehead.  Her blood pressure was elevated for paramedics. ? ?Staff at the facility reported that she was watching TV in the day room this morning when she fell or slid out of the chair.  She is not speaking to anyone but they report that that is typical for her at this time of day. ? ?She comes with paperwork from the facility that indicates that the patient has  DO NOT RESUSCITATE and "do not hospitalize" orders.  Her power of attorney is her son, [name], documented in both the paperwork from the facility and in [facility] documentation. ?  ? ? ?Physical Exam  ? ?Triage Vital Signs: ?ED Triage Vitals  ?Enc Vitals Group  ?   BP 09/21/21 0602 (!) 190/87  ?   Pulse Rate 09/21/21 0602 68  ?   Resp 09/21/21 0602 (!) 22  ?   Temp 09/21/21 0602 97.6 ?F (36.4 ?C)  ?   Temp Source 09/21/21 0602 Axillary  ?   SpO2 09/21/21 0602 96 %  ?   Weight 09/21/21 0600 51.9 kg (114 lb 6.7 oz)  ?   Height 09/21/21 0600 1.727 m (5\' 8" )  ?   Head Circumference --   ?   Peak Flow --   ?   Pain Score --   ?   Pain Loc --   ?   Pain Edu? --   ?   Excl. in [place]? --   ? ? ?Most recent vital signs: ?Vitals:  ? 09/21/21 0602 09/21/21 0615  ?BP: (!) 190/87   ?Pulse: 68 66  ?Resp: (!) 22 15  ?Temp: 97.6 ?F (36.4 ?C)   ?SpO2: 96% 97%  ? ? ? ?General: Awake, no distress.  Minimally responsive to stimuli but will aggressively localize to painful stimuli and attempts to examine her head wound.  At that point she will  localize and focus on people in the room and attempt to strike ED staff but calms down quickly again. ?CV:  Good peripheral perfusion.  Normal heart sounds. ?Resp:  Normal effort.  Lungs are clear to auscultation. ?Abd:  No distention.  No tenderness to palpation ?Other:  Obvious trauma to the patient's forehead with ecchymosis, hematoma, and 2 skin tears on the left side of her forehead, at least 4 cm in length, they are relatively superficial, initially oozing blood with some surrounding dried blood, but no longer bleeding.  Patient becomes very agitated with any attempts at manipulation or closure of the wound. ? ? ?ED Results / Procedures / Treatments  ? ? ?MEDICATIONS ORDERED IN ED: ?Medications - No data to display ? ? ?IMPRESSION / MDM / ASSESSMENT AND PLAN / ED COURSE  ?I reviewed the triage vital signs and the nursing notes. ?             ?               ? ?Differential diagnosis includes,  but is not limited to, accidental or mechanical fall, essential hypertension, hypertensive emergency, intracranial injury, laceration/skin tear. ? ?The patient has an obvious injury to her left forehead.  It may simply be a superficial skin tear but in general this type of injury, in a patient who cannot provide additional history, would require CT scan to rule out intracranial hemorrhage, skull fracture, cervical spine injury, etc.  She is in no distress.  Her blood pressure is elevated but this may be baseline for her.  No other obvious wounds. ? ?I reviewed her documentation as described above and see that she has a very explicit "do not hospitalize" order on her chart.  I will attempt to contact her son to discuss goals of care and what type of treatment or intervention he would prefer for her visit today. ? ? ? ? ?Clinical Course as of 09/21/21 0646  ?Sun Sep 21, 2021  ?0609 I had a conversation with the patient's son and healthcare power of attorney, [name].  He confirmed that the patient has a do not  hospitalize order on the chart and in fact stated that she was not supposed to be transported to the hospital under any circumstances.  I explained that she has a large skin tear to her left forehead and he said that he understands.  He understands that I cannot guarantee she does not have an intracranial injury or any other acute medical condition.  I explained that I could try to approximate the wound with adhesive tape but that she becomes quite combative when I attempt to examine the wound or manipulated in any way, and he said that he understands and would prefer that we simply care for the the wound with a nonadherent dressing, for example, but do not try any additional or aggressive treatment. ? ?Under the circumstances I think this is very reasonable and appropriate.  I will adhere to the wishes of the patient and her healthcare power of attorney.  I once again attempted to gently approximate the wound to use Steri-Strips but she will not cooperate and I do not want to cause her any additional distress.  We will apply a nonadherent dressing to her left forehead and will discharge her back to her facility without any additional intervention as per her son's request. [CF]  ?  ?Clinical Course User Index ?[CF] Hinda Kehr, MD  ? ? ? ?FINAL CLINICAL IMPRESSION(S) / ED DIAGNOSES  ? ?Final diagnoses:  ?Fall, initial encounter  ?Injury of head, initial encounter  ?Forehead laceration, initial encounter  ?Essential hypertension  ? ? ? ?Rx / DC Orders  ? ?ED Discharge Orders   ? ? None  ? ?  ? ? ? ?Note:  This document was prepared using Dragon voice recognition software and may include unintentional dictation errors. ?  ?Hinda Kehr, MD ?09/21/21 [phone] ? ?

## 2021-09-21 NOTE — ED Triage Notes (Signed)
Patient to ED via EMS from local nsg facility for an unwitnessed fall. Patient with laceration to forehead. ?

## 2021-09-21 NOTE — Discharge Instructions (Addendum)
We spoke by phone with Belva Chimes, the healthcare power of attorney and son of Ms. Nichole Travis.  He stated that he does not want anything done including closure of the wound given that it will upset her a great deal.  He does not want any medical work-up or trauma evaluation.  We placed a nonadherent dressing over the left forehead wound and are discharging her back to her facility as per Mr. Pilling wishes. ? ?Please change the nonadherent dressing twice daily, just as one would treat any other skin tear on her arms or legs.  Follow-up with her regular doctor at the next available opportunity. ?

## 2021-09-21 NOTE — ED Notes (Signed)
[facility] to transport pt to [facility] ?

## 2021-09-22 DIAGNOSIS — S0181XS Laceration without foreign body of other part of head, sequela: Secondary | ICD-10-CM | POA: Diagnosis not present

## 2021-11-06 DIAGNOSIS — F39 Unspecified mood [affective] disorder: Secondary | ICD-10-CM

## 2021-11-06 DIAGNOSIS — N184 Chronic kidney disease, stage 4 (severe): Secondary | ICD-10-CM | POA: Diagnosis not present

## 2021-11-06 DIAGNOSIS — E1159 Type 2 diabetes mellitus with other circulatory complications: Secondary | ICD-10-CM | POA: Diagnosis not present

## 2021-11-06 DIAGNOSIS — L120 Bullous pemphigoid: Secondary | ICD-10-CM

## 2021-11-06 DIAGNOSIS — I872 Venous insufficiency (chronic) (peripheral): Secondary | ICD-10-CM | POA: Diagnosis not present

## 2021-11-06 DIAGNOSIS — I10 Essential (primary) hypertension: Secondary | ICD-10-CM

## 2021-11-06 DIAGNOSIS — G301 Alzheimer's disease with late onset: Secondary | ICD-10-CM | POA: Diagnosis not present

## 2021-12-10 DIAGNOSIS — T24211A Burn of second degree of right thigh, initial encounter: Secondary | ICD-10-CM | POA: Diagnosis not present

## 2021-12-22 DIAGNOSIS — T24211A Burn of second degree of right thigh, initial encounter: Secondary | ICD-10-CM | POA: Diagnosis not present

## 2022-01-08 DIAGNOSIS — I6781 Acute cerebrovascular insufficiency: Secondary | ICD-10-CM | POA: Diagnosis not present

## 2022-01-12 ENCOUNTER — Telehealth: Payer: Self-pay | Admitting: Internal Medicine

## 2022-01-12 NOTE — Telephone Encounter (Signed)
Changed pt status to deceased in chart.

## 2022-01-12 NOTE — Telephone Encounter (Signed)
Patient son called and stated that patient passed away 09-28-2022 at [facility]. Nichole Travis will send a request death certificate. Also stated would like it done today. Call back number [phone].

## 2022-01-12 NOTE — Telephone Encounter (Signed)
Death certificate just completed

## 2022-01-27 DEATH — deceased
